# Patient Record
Sex: Female | Born: 1970 | Race: White | Hispanic: No | Smoking: Never smoker
Health system: Southern US, Community
[De-identification: ages and names within clinical notes are randomized; demographics above are authoritative.]

## PROBLEM LIST (undated history)

## (undated) DIAGNOSIS — R002 Palpitations: Secondary | ICD-10-CM

## (undated) DIAGNOSIS — Z8719 Personal history of other diseases of the digestive system: Secondary | ICD-10-CM

## (undated) DIAGNOSIS — R222 Localized swelling, mass and lump, trunk: Secondary | ICD-10-CM

## (undated) DIAGNOSIS — K8020 Calculus of gallbladder without cholecystitis without obstruction: Secondary | ICD-10-CM

## (undated) DIAGNOSIS — Z87448 Personal history of other diseases of urinary system: Secondary | ICD-10-CM

## (undated) DIAGNOSIS — M419 Scoliosis, unspecified: Secondary | ICD-10-CM

## (undated) DIAGNOSIS — Z8639 Personal history of other endocrine, nutritional and metabolic disease: Secondary | ICD-10-CM

## (undated) DIAGNOSIS — H9312 Tinnitus, left ear: Secondary | ICD-10-CM

## (undated) DIAGNOSIS — D179 Benign lipomatous neoplasm, unspecified: Secondary | ICD-10-CM

## (undated) HISTORY — DX: Benign lipomatous neoplasm, unspecified: D17.9

## (undated) HISTORY — PX: DILATION AND CURETTAGE OF UTERUS: SHX78

## (undated) HISTORY — PX: ANAL FISSURE REPAIR: SHX2312

---

## 1995-05-07 HISTORY — PX: BREAST SURGERY: SHX581

## 1998-05-19 ENCOUNTER — Ambulatory Visit (HOSPITAL_COMMUNITY): Admission: RE | Admit: 1998-05-19 | Discharge: 1998-05-19 | Payer: Self-pay | Admitting: *Deleted

## 1998-07-10 ENCOUNTER — Other Ambulatory Visit: Admission: RE | Admit: 1998-07-10 | Discharge: 1998-07-10 | Payer: Self-pay | Admitting: *Deleted

## 1999-03-27 ENCOUNTER — Inpatient Hospital Stay (HOSPITAL_COMMUNITY): Admission: AD | Admit: 1999-03-27 | Discharge: 1999-03-27 | Payer: Self-pay | Admitting: *Deleted

## 1999-06-29 ENCOUNTER — Inpatient Hospital Stay (HOSPITAL_COMMUNITY): Admission: AD | Admit: 1999-06-29 | Discharge: 1999-07-01 | Payer: Self-pay | Admitting: Obstetrics & Gynecology

## 1999-08-06 ENCOUNTER — Other Ambulatory Visit: Admission: RE | Admit: 1999-08-06 | Discharge: 1999-08-06 | Payer: Self-pay | Admitting: *Deleted

## 2000-01-17 ENCOUNTER — Ambulatory Visit (HOSPITAL_COMMUNITY): Admission: RE | Admit: 2000-01-17 | Discharge: 2000-01-17 | Payer: Self-pay | Admitting: Gastroenterology

## 2000-03-07 ENCOUNTER — Other Ambulatory Visit: Admission: RE | Admit: 2000-03-07 | Discharge: 2000-03-07 | Payer: Self-pay | Admitting: Obstetrics and Gynecology

## 2000-07-21 ENCOUNTER — Inpatient Hospital Stay (HOSPITAL_COMMUNITY): Admission: AD | Admit: 2000-07-21 | Discharge: 2000-07-21 | Payer: Self-pay | Admitting: Obstetrics and Gynecology

## 2000-10-15 ENCOUNTER — Inpatient Hospital Stay (HOSPITAL_COMMUNITY): Admission: AD | Admit: 2000-10-15 | Discharge: 2000-10-16 | Payer: Self-pay | Admitting: Obstetrics and Gynecology

## 2000-10-30 ENCOUNTER — Encounter: Admission: RE | Admit: 2000-10-30 | Discharge: 2000-11-29 | Payer: Self-pay | Admitting: Obstetrics and Gynecology

## 2000-11-18 ENCOUNTER — Other Ambulatory Visit: Admission: RE | Admit: 2000-11-18 | Discharge: 2000-11-18 | Payer: Self-pay | Admitting: Obstetrics and Gynecology

## 2001-11-23 ENCOUNTER — Other Ambulatory Visit: Admission: RE | Admit: 2001-11-23 | Discharge: 2001-11-23 | Payer: Self-pay | Admitting: Obstetrics and Gynecology

## 2002-01-19 ENCOUNTER — Emergency Department (HOSPITAL_COMMUNITY): Admission: EM | Admit: 2002-01-19 | Discharge: 2002-01-19 | Payer: Self-pay | Admitting: Emergency Medicine

## 2002-11-29 ENCOUNTER — Other Ambulatory Visit: Admission: RE | Admit: 2002-11-29 | Discharge: 2002-11-29 | Payer: Self-pay | Admitting: Obstetrics and Gynecology

## 2003-03-14 ENCOUNTER — Encounter: Admission: RE | Admit: 2003-03-14 | Discharge: 2003-03-14 | Payer: Self-pay | Admitting: Gastroenterology

## 2003-05-28 ENCOUNTER — Emergency Department (HOSPITAL_COMMUNITY): Admission: EM | Admit: 2003-05-28 | Discharge: 2003-05-28 | Payer: Self-pay | Admitting: Emergency Medicine

## 2006-03-13 ENCOUNTER — Encounter: Admission: RE | Admit: 2006-03-13 | Discharge: 2006-03-13 | Payer: Self-pay | Admitting: Obstetrics and Gynecology

## 2007-03-18 ENCOUNTER — Encounter: Admission: RE | Admit: 2007-03-18 | Discharge: 2007-03-18 | Payer: Self-pay | Admitting: Family Medicine

## 2009-03-10 ENCOUNTER — Encounter: Admission: RE | Admit: 2009-03-10 | Discharge: 2009-03-10 | Payer: Self-pay | Admitting: Obstetrics and Gynecology

## 2009-05-06 DIAGNOSIS — D179 Benign lipomatous neoplasm, unspecified: Secondary | ICD-10-CM

## 2009-05-06 HISTORY — DX: Benign lipomatous neoplasm, unspecified: D17.9

## 2009-12-28 ENCOUNTER — Encounter: Admission: RE | Admit: 2009-12-28 | Discharge: 2009-12-28 | Payer: Self-pay | Admitting: Otolaryngology

## 2010-04-18 ENCOUNTER — Encounter
Admission: RE | Admit: 2010-04-18 | Discharge: 2010-04-18 | Payer: Self-pay | Source: Home / Self Care | Attending: Obstetrics and Gynecology | Admitting: Obstetrics and Gynecology

## 2010-09-21 NOTE — H&P (Signed)
[facility]  Patient:    Jennifer Petersen, Jennifer Petersen                        MRN: [id number] Proc. Date: 06/29/99 Adm. Date:  [id number] Attending:  Genia Del                         History and Physical  DATE OF BIRTH:  10-27-1970  BRIEF HISTORY:  This is a 40 year old G2, P0, A1 with last menstrual period on ay 25, 2000, for an expected date of delivery on July 05, 1999, at 39 weeks and 1 ay gestation.  REASON FOR ADMISSION:  Uterine contractions and regular every 3 minutes since 1:00 a.m.  HISTORY OF PRESENT ILLNESS:  The patient started experiencing uterine contractions during the evening.  They increased and became regular at around 1 a.m. on June 29, 1999.  There were no fluid leaks, no vaginal bleeding. The fetal movements ere positive and no PIH symptoms.  ALLERGIES:  The patient has no known drug allergies.  She was on prenatal vitamins only.  PAST MEDICAL HISTORY:  Positive for breast augmentation and D&C in December of 1999.  PAST OBSTETRICAL HISTORY:  December 1999 - [redacted] weeks gestation started as abortion - incomplete. D&C no complication.  PAST GYNECOLOGIC HISTORY:  Chlamydia treated with antibiotic.  SOCIAL HISTORY:   The patient is married.  She is a nonsmoker.  HISTORY OF PRESENT PREGNANCY:  She had mild spotting in the first trimester. The first semester labs showed a hemoglobin at 13.2, platelets 333, blood type A+, antibodies negative.  RPR nonreactive.  HBSAG negative.  HIV negative in December of 1999 and not redone.  The Pap test was normal in March of 2000.  Gonorrhea and chlamydia were negative in August 2000.  The patient had the triple test in the  second trimester at 16 weeks which came back within normal limits.  She had an ultrasound for review of anatomy at 28 weeks which showed normal review of anatomy and normal placenta with cervical length at 3.85.  The patient had in the third  trimester at  [redacted] weeks gestation a 1 hour glucose tolerance test which was normal and she had group B strep at 35-36 which was negative.  She had a repeat ultrasound at 28 weeks which showed a slightly increased amniotic fluid index at 81 to 99%  percentile and an estimated fetal weight at the 86% percentile.  Dopplers were normal.  Then follow up on amniotic fluid was done at 34 plus weeks and at that  time the amniotic fluid was within normal limits at the 83rd percentile.  The estimated fetal weight was 47 percentile.  The pregnancy was otherwise normal with normal weight gain and normal blood pressure all along.  REVIEW OF SYSTEMS:  Constitutional:  Negative.  HEENT:  Within normal limits. Cardiovascular:  Negative.  Respiratory:  Negative.  Gastrointestinal: Negative. URologic:  Negative.  Neurologic:  Negative.  Dermatome:  Negative.  PHYSICAL EXAMINATION:  GENERAL:  On arrival the patient was uncomfortable with uterine contractions otherwise no apparent distress.  VITAL SIGNS:  Blood pressure 113/69, pulse 88, respiratory rate 24. Temperature was 99.4.  HEART:  S1, S2 normal.  No S3, S4.  No murmur.  HEENT:  Within normal limits.  LUNGS:  Clear.  ABDOMEN:  Gravid with a uterine height at 39 cm per ______.  The presentation is vertex  EXTREMITIES:  Lower limbs are normal and no clonus.  DTRs 2/4 bilaterally.  VAGINAL EXAM:  On arrival at maternity admission shows a cervix that 4 cm, 90% vertex -2, with bulging membranes.  The uterine contractions were every 3-4 minutes during 60 seconds and with moderate intensity.  The fetal heart rate showed accelerations with variability and no deceleration with a baseline around 140-160.  IMPRESSION:  G2, P0, A1 at 39 weeks and 1 day gestation in active labor with reassuring fetal heart rate, group B strep negative.  PLAN:  Admission to labor and delivery.  Continuous fetal heart rate monitoring. Nubain IV and subcu p.r.n., epidural  p.r.n.   Will follow expectantly to a probable vaginal delivery. DD:  06/29/99 TD:  06/29/99 Job: [id number]

## 2010-09-21 NOTE — Procedures (Signed)
[place]  Patient:    Jennifer Petersen, Jennifer Petersen                        MRN: [id number] Proc. Date: 01/17/00 Adm. Date:  [id number] Attending:  Charna Elizabeth CC:         Sung Amabile. Roslyn Smiling, M.D.   Procedure Report  DATE OF BIRTH:  August 08, 1970  PROCEDURE PERFORMED:  Colonoscopy.  ENDOSCOPIST:  Anselmo Rod, M.D.  INSTRUMENT USED:  Olympus video colonoscope.  INDICATIONS:  Rectal bleeding and family history of colon cancer in a 40 year old white female.  Rule out colonic polyps, masses, hemorrhoids, etc.  PREPROCEDURE PREPARATION:  Informed consent was procured from the patient. The patient was fasted for 8 hours prior to the procedure and prepped with a bottle of magnesium citrate and a gallon of NuLytely the night prior to the procedure.  PREPROCEDURE PHYSICAL:  Patient has stable vital signs.  NECK: Supple.  CHEST:  Clear to auscultation. S1, S2 regular.  ABDOMEN:  Soft with normal abdominal bowel sounds.  No hepatosplenomegaly, no masses palpable.  DESCRIPTION OF PROCEDURE:  The patient was placed in left lateral decubitus position and sedated with 50 mg of Demerol and 6 mg of Versed intravenously. Once the patient was adequately sedated and maintained on low-flow oxygen and continuous cardiac monitoring, the Olympus video colonoscope was advanced from the rectum to the cecum without difficulty.  Except for small internal hemorrhoids seen on retroflexion and small external anal tag, no other abnormalities were seen.  No masses or polyps were present.  There was no evidence of diverticular disease.  The patient tolerated the procedure well without complication.  IMPRESSION:  Essentially normal colonoscopy, except for small external anal tag and small internal hemorrhoids.  RECOMMENDATIONS: 1. Colorectal cancer screening is recommended in the next 10 years. 2. A high-fiber diet with liberal fluid intake has been recommended. 3. If the  patient has any abdominal GI symptoms, she is to come to the office    on a p.r.n. basis. DD:  01/17/00 TD:  01/18/00 Job: [id number]

## 2011-03-14 ENCOUNTER — Other Ambulatory Visit: Payer: Self-pay | Admitting: Obstetrics and Gynecology

## 2011-03-14 DIAGNOSIS — Z1231 Encounter for screening mammogram for malignant neoplasm of breast: Secondary | ICD-10-CM

## 2011-04-22 ENCOUNTER — Ambulatory Visit
Admission: RE | Admit: 2011-04-22 | Discharge: 2011-04-22 | Disposition: A | Discharge status: Home / Self Care | Payer: BC Managed Care – PPO | Source: Ambulatory Visit | Attending: Obstetrics and Gynecology | Admitting: Obstetrics and Gynecology

## 2011-04-22 DIAGNOSIS — Z1231 Encounter for screening mammogram for malignant neoplasm of breast: Secondary | ICD-10-CM

## 2011-06-18 ENCOUNTER — Other Ambulatory Visit: Payer: Self-pay | Admitting: Otolaryngology

## 2011-06-18 DIAGNOSIS — R220 Localized swelling, mass and lump, head: Secondary | ICD-10-CM

## 2011-06-20 ENCOUNTER — Ambulatory Visit
Admission: RE | Admit: 2011-06-20 | Discharge: 2011-06-20 | Disposition: A | Discharge status: Home / Self Care | Payer: BC Managed Care – PPO | Source: Ambulatory Visit | Attending: Otolaryngology | Admitting: Otolaryngology

## 2011-06-20 DIAGNOSIS — R221 Localized swelling, mass and lump, neck: Secondary | ICD-10-CM

## 2011-06-20 DIAGNOSIS — R220 Localized swelling, mass and lump, head: Secondary | ICD-10-CM

## 2011-07-02 ENCOUNTER — Other Ambulatory Visit: Payer: Self-pay | Admitting: Otolaryngology

## 2012-03-27 ENCOUNTER — Other Ambulatory Visit: Payer: Self-pay | Admitting: Dermatology

## 2012-03-30 ENCOUNTER — Other Ambulatory Visit: Payer: Self-pay | Admitting: Obstetrics and Gynecology

## 2012-03-30 DIAGNOSIS — Z1231 Encounter for screening mammogram for malignant neoplasm of breast: Secondary | ICD-10-CM

## 2012-04-05 HISTORY — PX: LIPOMA EXCISION: SHX5283

## 2012-05-20 ENCOUNTER — Ambulatory Visit
Admission: RE | Admit: 2012-05-20 | Discharge: 2012-05-20 | Disposition: A | Discharge status: Home / Self Care | Payer: BC Managed Care – PPO | Source: Ambulatory Visit | Attending: Obstetrics and Gynecology | Admitting: Obstetrics and Gynecology

## 2012-05-20 DIAGNOSIS — Z1231 Encounter for screening mammogram for malignant neoplasm of breast: Secondary | ICD-10-CM

## 2013-05-05 ENCOUNTER — Other Ambulatory Visit: Payer: Self-pay

## 2013-05-05 DIAGNOSIS — Z9882 Breast implant status: Secondary | ICD-10-CM

## 2013-05-05 DIAGNOSIS — Z1231 Encounter for screening mammogram for malignant neoplasm of breast: Secondary | ICD-10-CM

## 2013-06-02 ENCOUNTER — Ambulatory Visit
Admission: RE | Admit: 2013-06-02 | Discharge: 2013-06-02 | Disposition: A | Discharge status: Home / Self Care | Payer: BC Managed Care – PPO | Source: Ambulatory Visit

## 2013-06-02 DIAGNOSIS — Z1231 Encounter for screening mammogram for malignant neoplasm of breast: Secondary | ICD-10-CM

## 2013-06-02 DIAGNOSIS — Z9882 Breast implant status: Secondary | ICD-10-CM

## 2014-06-13 ENCOUNTER — Other Ambulatory Visit: Payer: Self-pay

## 2014-06-13 DIAGNOSIS — Z1231 Encounter for screening mammogram for malignant neoplasm of breast: Secondary | ICD-10-CM

## 2014-06-21 ENCOUNTER — Ambulatory Visit
Admission: RE | Admit: 2014-06-21 | Discharge: 2014-06-21 | Disposition: A | Discharge status: Home / Self Care | Payer: BLUE CROSS/BLUE SHIELD | Source: Ambulatory Visit

## 2014-06-21 ENCOUNTER — Encounter (INDEPENDENT_AMBULATORY_CARE_PROVIDER_SITE_OTHER): Payer: Self-pay

## 2014-06-21 DIAGNOSIS — Z1231 Encounter for screening mammogram for malignant neoplasm of breast: Secondary | ICD-10-CM

## 2015-05-26 ENCOUNTER — Other Ambulatory Visit: Payer: Self-pay

## 2015-05-26 DIAGNOSIS — Z1231 Encounter for screening mammogram for malignant neoplasm of breast: Secondary | ICD-10-CM

## 2015-06-28 ENCOUNTER — Ambulatory Visit
Admission: RE | Admit: 2015-06-28 | Discharge: 2015-06-28 | Disposition: A | Discharge status: Home / Self Care | Payer: BLUE CROSS/BLUE SHIELD | Source: Ambulatory Visit

## 2015-06-28 DIAGNOSIS — Z1231 Encounter for screening mammogram for malignant neoplasm of breast: Secondary | ICD-10-CM

## 2015-07-10 ENCOUNTER — Other Ambulatory Visit: Payer: Self-pay

## 2015-07-10 ENCOUNTER — Ambulatory Visit
Admission: RE | Admit: 2015-07-10 | Discharge: 2015-07-10 | Disposition: A | Discharge status: Home / Self Care | Payer: BLUE CROSS/BLUE SHIELD | Source: Ambulatory Visit

## 2015-07-10 ENCOUNTER — Ambulatory Visit: Payer: BLUE CROSS/BLUE SHIELD

## 2015-07-10 DIAGNOSIS — Z1231 Encounter for screening mammogram for malignant neoplasm of breast: Secondary | ICD-10-CM

## 2015-07-12 ENCOUNTER — Ambulatory Visit: Payer: BLUE CROSS/BLUE SHIELD

## 2016-05-14 DIAGNOSIS — Z800 Family history of malignant neoplasm of digestive organs: Secondary | ICD-10-CM | POA: Diagnosis not present

## 2016-05-14 DIAGNOSIS — Z1211 Encounter for screening for malignant neoplasm of colon: Secondary | ICD-10-CM | POA: Diagnosis not present

## 2016-05-14 LAB — BASIC METABOLIC PANEL
BUN: 16 (ref 4–21)
BUN: 16 (ref 4–21)
CREATININE: 0.6 (ref 0.5–1.1)
Creatinine: 0.6 (ref <=1.1)
Glucose: 95
POTASSIUM: 5.3 (ref 3.4–5.3)
Potassium: 5.3 (ref 3.4–5.3)
SODIUM: 141 (ref 137–147)
Sodium: 141 (ref 137–147)

## 2016-05-14 LAB — CBC AND DIFFERENTIAL
HCT: 38 (ref 36–46)
HCT: 38 (ref 36–46)
Hemoglobin: 12.8 (ref 12.0–16.0)
Hemoglobin: 12.8 (ref 12.0–16.0)
Platelets: 357 (ref 150–399)
Platelets: 357 (ref 150–399)
WBC: 7.2
WBC: 7.2

## 2016-05-14 LAB — HEPATIC FUNCTION PANEL
ALK PHOS: 34 (ref 25–125)
ALT: 11 (ref 7–35)
ALT: 11 (ref 7–35)
AST: 18 (ref 13–35)
AST: 18 (ref 13–35)
Alkaline Phosphatase: 34 (ref 25–125)
Bilirubin, Total: 0.5

## 2016-05-14 LAB — TSH: TSH: 1.34 (ref 0.41–5.90)

## 2016-07-12 DIAGNOSIS — Z1211 Encounter for screening for malignant neoplasm of colon: Secondary | ICD-10-CM | POA: Diagnosis not present

## 2016-07-12 DIAGNOSIS — Z800 Family history of malignant neoplasm of digestive organs: Secondary | ICD-10-CM | POA: Diagnosis not present

## 2016-07-12 HISTORY — PX: COLONOSCOPY: SHX174

## 2016-08-26 DIAGNOSIS — D2239 Melanocytic nevi of other parts of face: Secondary | ICD-10-CM | POA: Diagnosis not present

## 2016-08-26 DIAGNOSIS — D2262 Melanocytic nevi of left upper limb, including shoulder: Secondary | ICD-10-CM | POA: Diagnosis not present

## 2016-08-26 DIAGNOSIS — L821 Other seborrheic keratosis: Secondary | ICD-10-CM | POA: Diagnosis not present

## 2016-08-26 DIAGNOSIS — D2261 Melanocytic nevi of right upper limb, including shoulder: Secondary | ICD-10-CM | POA: Diagnosis not present

## 2016-09-27 DIAGNOSIS — Z13220 Encounter for screening for lipoid disorders: Secondary | ICD-10-CM | POA: Diagnosis not present

## 2016-09-27 DIAGNOSIS — Z0000 Encounter for general adult medical examination without abnormal findings: Secondary | ICD-10-CM | POA: Diagnosis not present

## 2016-09-27 DIAGNOSIS — Z130 Encounter for screening for diseases of the blood and blood-forming organs and certain disorders involving the immune mechanism: Secondary | ICD-10-CM | POA: Diagnosis not present

## 2016-09-27 DIAGNOSIS — N951 Menopausal and female climacteric states: Secondary | ICD-10-CM | POA: Diagnosis not present

## 2016-09-27 DIAGNOSIS — Z1321 Encounter for screening for nutritional disorder: Secondary | ICD-10-CM | POA: Diagnosis not present

## 2016-09-27 DIAGNOSIS — Z1329 Encounter for screening for other suspected endocrine disorder: Secondary | ICD-10-CM | POA: Diagnosis not present

## 2016-09-27 LAB — LIPID PANEL
Cholesterol: 220 — AB (ref 0–200)
HDL: 71 — AB (ref 35–70)
LDL Cholesterol: 131
Triglycerides: 89 (ref 40–160)

## 2016-09-27 LAB — CBC AND DIFFERENTIAL
HCT: 41 (ref 36–46)
Hemoglobin: 12.9 (ref 12.0–16.0)
Platelets: 310 (ref 150–399)
WBC: 5.5

## 2016-09-27 LAB — BASIC METABOLIC PANEL
Creatinine: 0.7 (ref <=1.1)
Glucose: 105
Sodium: 142 (ref 137–147)

## 2016-09-27 LAB — HEPATIC FUNCTION PANEL
ALT: 14 (ref 7–35)
AST: 17 (ref 13–35)
Alkaline Phosphatase: 32 (ref 25–125)
Bilirubin, Total: 0.4

## 2016-09-27 LAB — TSH: TSH: 1.25 (ref <=5.90)

## 2016-10-02 DIAGNOSIS — Z1231 Encounter for screening mammogram for malignant neoplasm of breast: Secondary | ICD-10-CM | POA: Diagnosis not present

## 2016-10-02 DIAGNOSIS — Z01419 Encounter for gynecological examination (general) (routine) without abnormal findings: Secondary | ICD-10-CM | POA: Diagnosis not present

## 2016-10-02 DIAGNOSIS — Z6825 Body mass index (BMI) 25.0-25.9, adult: Secondary | ICD-10-CM | POA: Diagnosis not present

## 2016-10-02 DIAGNOSIS — R300 Dysuria: Secondary | ICD-10-CM | POA: Diagnosis not present

## 2016-10-08 ENCOUNTER — Ambulatory Visit (INDEPENDENT_AMBULATORY_CARE_PROVIDER_SITE_OTHER): Payer: BLUE CROSS/BLUE SHIELD | Admitting: Family Medicine

## 2016-10-08 ENCOUNTER — Encounter: Payer: Self-pay | Admitting: Family Medicine

## 2016-10-08 VITALS — BP 106/72 | HR 65 | Ht 68.0 in | Wt 166.0 lb

## 2016-10-08 DIAGNOSIS — R002 Palpitations: Secondary | ICD-10-CM | POA: Insufficient documentation

## 2016-10-08 DIAGNOSIS — Z8639 Personal history of other endocrine, nutritional and metabolic disease: Secondary | ICD-10-CM

## 2016-10-08 DIAGNOSIS — Z8051 Family history of malignant neoplasm of kidney: Secondary | ICD-10-CM

## 2016-10-08 DIAGNOSIS — Z9882 Breast implant status: Secondary | ICD-10-CM | POA: Insufficient documentation

## 2016-10-08 DIAGNOSIS — Z83438 Family history of other disorder of lipoprotein metabolism and other lipidemia: Secondary | ICD-10-CM | POA: Insufficient documentation

## 2016-10-08 DIAGNOSIS — Z833 Family history of diabetes mellitus: Secondary | ICD-10-CM | POA: Insufficient documentation

## 2016-10-08 DIAGNOSIS — Z832 Family history of diseases of the blood and blood-forming organs and certain disorders involving the immune mechanism: Secondary | ICD-10-CM

## 2016-10-08 DIAGNOSIS — Z635 Disruption of family by separation and divorce: Secondary | ICD-10-CM | POA: Insufficient documentation

## 2016-10-08 DIAGNOSIS — Z8719 Personal history of other diseases of the digestive system: Secondary | ICD-10-CM | POA: Insufficient documentation

## 2016-10-08 DIAGNOSIS — Z8249 Family history of ischemic heart disease and other diseases of the circulatory system: Secondary | ICD-10-CM

## 2016-10-08 DIAGNOSIS — R635 Abnormal weight gain: Secondary | ICD-10-CM

## 2016-10-08 DIAGNOSIS — Z86018 Personal history of other benign neoplasm: Secondary | ICD-10-CM | POA: Insufficient documentation

## 2016-10-08 DIAGNOSIS — M4120 Other idiopathic scoliosis, site unspecified: Secondary | ICD-10-CM | POA: Insufficient documentation

## 2016-10-08 NOTE — Progress Notes (Signed)
New patient office visit note:  Impression and Recommendations:    1. Unexplained weight gain/ difficult to lose   2. Fluttering sensation of heart   3. Family history of renal cancer * 2 and prostate ca in Father   4. Family history of mixed hyperlipidemia   5. Family history of early CAD-  P GF 3 AMI by age 48   6. Family history of diabetes mellitus   7. Hx of hyperkalemia- Asx      No problem-specific Assessment & Plan notes found for this encounter.   The patient was counseled, risk factors were discussed, anticipatory guidance given.   No orders of the defined types were placed in this encounter.    Discontinued Medications   No medications on file      No orders of the defined types were placed in this encounter.    Gross side effects, risk and benefits, and alternatives of medications discussed with patient.  Patient is aware that all medications have potential side effects and we are unable to predict every side effect or drug-drug interaction that may occur.  Expresses verbal understanding and consents to current therapy plan and treatment regimen.  Return in about 3 months (around 01/08/2017) for sooner if pt desires wt mgt counseling.  Please see AVS handed out to patient at the end of our visit for further patient instructions/ counseling done pertaining to today's office visit.    Note: This document was prepared using Dragon voice recognition software and may include unintentional dictation errors.  ----------------------------------------------------------------------------------------------------------------------    Subjective:    Chief complaint:   Chief Complaint  Patient presents with  . Establish Care     HPI: Jennifer Petersen is a pleasant 46 y.o. female who presents to [place] at [facility] today to review their medical history with me and establish care.   I asked the patient to review their chronic problem list  with me to ensure everything was updated and accurate.    All recent office visits with other providers, any medical records that patient brought in etc  - I reviewed today.     Also asked pt to get me medical records from [facility] providers/ specialists that they had seen within the past 3-5 years- if they are in private practice and/or do not work for a [employer], [facility], [place], [place] or [employer] owned practice.  Told them to call their specialists to clarify this if they are not sure.    Worried abiout wt gain and harder to get off lately.  Hormones checked by GYN, and [facility].  - working out with trainer at [employer]- twice [employer].   Used to be able to always lose wt easily   High K levels- recently at other doc OV's.   H/o sensation of fluttering of heart- lasting a few seconds only.  -   No Sx, not related to exercise/ exertion etc   Wt Readings from Last 3 Encounters:  10/08/16 166 lb (75.3 kg)   BP Readings from Last 3 Encounters:  10/08/16 106/72   Pulse Readings from Last 3 Encounters:  10/08/16 65   BMI Readings from Last 3 Encounters:  10/08/16 25.24 kg/m    Patient Care Team    Relationship Specialty Notifications Start End  Mellody Dance, DO PCP - General Family Medicine  10/08/16   Juanita Craver, MD Consulting Physician Gastroenterology  10/08/16   Martinique, Amy, MD Consulting Physician  Dermatology  10/08/16   Brien Few, MD Consulting Physician Obstetrics and Gynecology  10/08/16     Patient Active Problem List   Diagnosis Date Noted  . Scoliosis (and kyphoscoliosis), idiopathic 10/08/2016  . Unexplained weight gain/ difficult to lose 10/08/2016  . Family history of renal cancer * 2 and prostate ca in Father 10/08/2016  . Family history of mixed hyperlipidemia 10/08/2016  . Family history of early CAD-  P GF 3 AMI by age 35 10/08/2016  . Family history of diabetes mellitus 10/08/2016  . h/o Divorce 10/08/2016  . History of anal fissure repair  10/08/2016  . H/O bilateral breast implants 10/08/2016  . H/O lipoma- sx removed 10/08/2016  . Hx of hyperkalemia- Asx 10/08/2016  . Fluttering sensation of heart 10/08/2016     Past Medical History:  Diagnosis Date  . Lipoma 2011     Past Medical History:  Diagnosis Date  . Lipoma 2011     Past Surgical History:  Procedure Laterality Date  . BREAST SURGERY  1997   Implants     Family History  Problem Relation Age of Onset  . Heart attack Maternal Grandmother   . Diabetes Maternal Grandmother   . Heart attack Paternal Grandfather      History  Drug Use No     History  Alcohol Use No     History  Smoking Status  . Never Smoker  Smokeless Tobacco  . Never Used     No outpatient encounter prescriptions on file as of 10/08/2016.   No facility-administered encounter medications on file as of 10/08/2016.     Allergies: Patient has no known allergies.   ROS   Objective:   Blood pressure 106/72, pulse 65, height 5\' 8"  (1.727 m), weight 166 lb (75.3 kg), last menstrual period 09/21/2016, SpO2 100 %. Body mass index is 25.24 kg/m. General: Well Developed, well nourished, and in no acute distress.  Neuro: Alert and oriented x3, extra-ocular muscles intact, sensation grossly intact.  HEENT:Colony/AT, PERRLA, neck supple, No carotid bruits Skin: no gross rashes  Cardiac: Regular rate and rhythm Respiratory: Essentially clear to auscultation bilaterally. Not using accessory muscles, speaking in full sentences.  Abdominal: not grossly distended Musculoskeletal: Ambulates w/o diff, FROM * 4 ext.  Vasc: less 2 sec cap RF, warm and pink  Psych:  No HI/SI, judgement and insight good, Euthymic mood. Full Affect.    No results found for this or any previous visit (from the past 2160 hour(s)).

## 2016-10-08 NOTE — Patient Instructions (Addendum)
I recommended to patient that she use an app such as lose it rather than her current one, which would help [place] gauge percentage of protein that she eats, carbs and fats etc.     Please realize, EXERCISE IS MEDICINE!  -  American Heart Association [facility]) guidelines for exercise : If you are in good health, without any medical conditions, you should engage in 150 minutes of moderate intensity aerobic activity per week.  This means you should be huffing and puffing throughout your workout.   Engaging in regular exercise will improve brain function and memory, as well as improve mood, boost immune system and help with weight management.  As well as the other, more well-known effects of exercise such as decreasing blood sugar levels, decreasing blood pressure,  and decreasing bad cholesterol levels/ increasing good cholesterol levels.     -  The AHA strongly endorses consumption of a diet that contains a variety of foods from all the food categories with an emphasis on fruits and vegetables; fat-free and low-fat dairy products; cereal and grain products; legumes and nuts; and fish, poultry, and/or extra lean meats.    Excessive food intake, especially of foods high in saturated and trans fats, sugar, and salt, should be avoided.    Adequate water intake of roughly 1/2 of your weight in pounds, should equal the ounces of water per day you should drink.  So for instance, if you're 200 pounds, that would be 100 ounces of water per day.         Mediterranean Diet  Why follow it? Research shows. . Those who follow the Mediterranean diet have a reduced risk of heart disease  . The diet is associated with a reduced incidence of Parkinson's and Alzheimer's diseases . People following the diet may have longer life expectancies and lower rates of chronic diseases  . The Dietary Guidelines for Americans recommends the Mediterranean diet as an eating plan to promote health and prevent disease  What Is the  Mediterranean Diet?  . Healthy eating plan based on typical foods and recipes of Mediterranean-style cooking . The diet is primarily a plant based diet; these foods should make up a majority of meals   Starches - Plant based foods should make up a majority of meals - They are an important sources of vitamins, minerals, energy, antioxidants, and fiber - Choose whole grains, foods high in fiber and minimally processed items  - Typical grain sources include wheat, oats, barley, corn, brown rice, bulgar, farro, millet, polenta, couscous  - Various types of beans include chickpeas, lentils, fava beans, black beans, white beans   Fruits  Veggies - Large quantities of antioxidant rich fruits & veggies; 6 or more servings  - Vegetables can be eaten raw or lightly drizzled with oil and cooked  - Vegetables common to the traditional Mediterranean Diet include: artichokes, arugula, beets, broccoli, brussel sprouts, cabbage, carrots, celery, collard greens, cucumbers, eggplant, kale, leeks, lemons, lettuce, mushrooms, okra, onions, peas, peppers, potatoes, pumpkin, radishes, rutabaga, shallots, spinach, sweet potatoes, turnips, zucchini - Fruits common to the Mediterranean Diet include: apples, apricots, avocados, cherries, clementines, dates, figs, grapefruits, grapes, melons, nectarines, oranges, peaches, pears, pomegranates, strawberries, tangerines  Fats - Replace butter and margarine with healthy oils, such as olive oil, canola oil, and tahini  - Limit nuts to no more than a handful a day  - Nuts include walnuts, almonds, pecans, pistachios, pine nuts  - Limit or avoid candied, honey roasted or heavily salted  nuts - Olives are central to the Mediterranean diet - can be eaten whole or used in a variety of dishes   Meats Protein - Limiting red meat: no more than a few times a month - When eating red meat: choose lean cuts and keep the portion to the size of deck of cards - Eggs: approx. 0 to 4 times a  week  - Fish and lean poultry: at least 2 a week  - Healthy protein sources include, chicken, [place], lean beef, lamb - Increase intake of seafood such as tuna, salmon, trout, mackerel, shrimp, scallops - Avoid or limit high fat processed meats such as sausage and bacon  Dairy - Include moderate amounts of low fat dairy products  - Focus on healthy dairy such as fat free yogurt, skim milk, low or reduced fat cheese - Limit dairy products higher in fat such as whole or 2% milk, cheese, ice cream  Alcohol - Moderate amounts of red wine is ok  - No more than 5 oz daily for women (all ages) and men older than age 4  - No more than 10 oz of wine daily for men younger than 69  Other - Limit sweets and other desserts  - Use herbs and spices instead of salt to flavor foods  - Herbs and spices common to the traditional Mediterranean Diet include: basil, bay leaves, chives, cloves, cumin, fennel, garlic, lavender, marjoram, mint, oregano, parsley, pepper, rosemary, sage, savory, sumac, tarragon, thyme   It's not just a diet, it's a lifestyle:  . The Mediterranean diet includes lifestyle factors typical of those in the region  . Foods, drinks and meals are best eaten with others and savored . Daily physical activity is important for overall good health . This could be strenuous exercise like running and aerobics . This could also be more leisurely activities such as walking, housework, yard-work, or taking the stairs . Moderation is the key; a balanced and healthy diet accommodates most foods and drinks . Consider portion sizes and frequency of consumption of certain foods   Meal Ideas & Options:  . Breakfast:  o Whole wheat toast or whole wheat English muffins with peanut butter & hard boiled egg o Steel cut oats topped with apples & cinnamon and skim milk  o Fresh fruit: banana, strawberries, melon, berries, peaches  o Smoothies: strawberries, bananas, greek yogurt, peanut butter o Low fat  greek yogurt with blueberries and granola  o Egg white omelet with spinach and mushrooms o Breakfast couscous: whole wheat couscous, apricots, skim milk, cranberries  . Sandwiches:  o Hummus and grilled vegetables (peppers, zucchini, squash) on whole wheat bread   o Grilled chicken on whole wheat pita with lettuce, tomatoes, cucumbers or tzatziki  o Tuna salad on whole wheat bread: tuna salad made with greek yogurt, olives, red peppers, capers, green onions o Garlic rosemary lamb pita: lamb sauted with garlic, rosemary, salt & pepper; add lettuce, cucumber, greek yogurt to pita - flavor with lemon juice and black pepper  . Seafood:  o Mediterranean grilled salmon, seasoned with garlic, basil, parsley, lemon juice and black pepper o Shrimp, lemon, and spinach whole-grain pasta salad made with low fat greek yogurt  o Seared scallops with lemon orzo  o Seared tuna steaks seasoned salt, pepper, coriander topped with tomato mixture of olives, tomatoes, olive oil, minced garlic, parsley, green onions and cappers  . Meats:  o Herbed greek chicken salad with kalamata olives, cucumber, feta  o Red bell  peppers stuffed with spinach, bulgur, lean ground beef (or lentils) & topped with feta   o Kebabs: skewers of chicken, tomatoes, onions, zucchini, squash  o [place] burgers: made with red onions, mint, dill, lemon juice, feta cheese topped with roasted red peppers . Vegetarian o Cucumber salad: cucumbers, artichoke hearts, celery, red onion, feta cheese, tossed in olive oil & lemon juice  o Hummus and whole grain pita points with a greek salad (lettuce, tomato, feta, olives, cucumbers, red onion) o Lentil soup with celery, carrots made with vegetable broth, garlic, salt and pepper  o Tabouli salad: parsley, bulgur, mint, scallions, cucumbers, tomato, radishes, lemon juice, olive oil, salt and pepper.

## 2017-01-13 ENCOUNTER — Ambulatory Visit: Payer: BLUE CROSS/BLUE SHIELD | Admitting: Family Medicine

## 2017-06-11 ENCOUNTER — Telehealth: Payer: Self-pay | Admitting: Family Medicine

## 2017-06-11 NOTE — Telephone Encounter (Signed)
Left patient message to contact office for Provider required OV.  --glh

## 2017-06-18 DIAGNOSIS — H93A9 Pulsatile tinnitus, unspecified ear: Secondary | ICD-10-CM | POA: Diagnosis not present

## 2017-06-26 ENCOUNTER — Other Ambulatory Visit: Payer: Self-pay | Admitting: Otolaryngology

## 2017-06-26 DIAGNOSIS — H93A9 Pulsatile tinnitus, unspecified ear: Secondary | ICD-10-CM

## 2017-06-30 ENCOUNTER — Other Ambulatory Visit: Payer: Self-pay | Admitting: Otolaryngology

## 2017-06-30 DIAGNOSIS — H93A2 Pulsatile tinnitus, left ear: Secondary | ICD-10-CM

## 2017-07-05 ENCOUNTER — Emergency Department (HOSPITAL_COMMUNITY)
Admission: EM | Admit: 2017-07-05 | Discharge: 2017-07-05 | Disposition: A | Discharge status: Home / Self Care | Payer: BLUE CROSS/BLUE SHIELD | Attending: Emergency Medicine | Admitting: Emergency Medicine

## 2017-07-05 ENCOUNTER — Other Ambulatory Visit: Payer: Self-pay

## 2017-07-05 ENCOUNTER — Emergency Department (HOSPITAL_COMMUNITY): Payer: BLUE CROSS/BLUE SHIELD

## 2017-07-05 ENCOUNTER — Encounter (HOSPITAL_COMMUNITY): Payer: Self-pay | Admitting: Emergency Medicine

## 2017-07-05 DIAGNOSIS — R002 Palpitations: Secondary | ICD-10-CM | POA: Diagnosis not present

## 2017-07-05 DIAGNOSIS — R112 Nausea with vomiting, unspecified: Secondary | ICD-10-CM | POA: Insufficient documentation

## 2017-07-05 DIAGNOSIS — R072 Precordial pain: Secondary | ICD-10-CM | POA: Diagnosis not present

## 2017-07-05 DIAGNOSIS — Z86018 Personal history of other benign neoplasm: Secondary | ICD-10-CM | POA: Insufficient documentation

## 2017-07-05 DIAGNOSIS — R079 Chest pain, unspecified: Secondary | ICD-10-CM | POA: Diagnosis not present

## 2017-07-05 LAB — I-STAT BETA HCG BLOOD, ED (MC, WL, AP ONLY): I-stat hCG, quantitative: <5 m[IU]/mL (ref <5)

## 2017-07-05 LAB — CBC
HEMATOCRIT: 39.1 % (ref 36.0–46.0)
HEMOGLOBIN: 13.2 g/dL (ref 12.0–15.0)
MCH: 29.5 pg (ref 26.0–34.0)
MCHC: 33.8 g/dL (ref 30.0–36.0)
MCV: 87.3 fL (ref 78.0–100.0)
Platelets: 297 10*3/uL (ref 150–400)
RBC: 4.48 MIL/uL (ref 3.87–5.11)
RDW: 13.6 % (ref 11.5–15.5)
WBC: 12.5 10*3/uL — ABNORMAL HIGH (ref 4.0–10.5)

## 2017-07-05 LAB — BASIC METABOLIC PANEL
Anion gap: 8 (ref 5–15)
BUN: 15 mg/dL (ref 6–20)
CO2: 23 mmol/L (ref 22–32)
Calcium: 8.6 mg/dL — ABNORMAL LOW (ref 8.9–10.3)
Chloride: 106 mmol/L (ref 101–111)
Creatinine, Ser: 0.56 mg/dL (ref 0.44–1.00)
GFR calc Af Amer: >60 mL/min (ref >60)
GFR calc non Af Amer: >60 mL/min (ref >60)
GLUCOSE: 105 mg/dL — AB (ref 65–99)
POTASSIUM: 4.4 mmol/L (ref 3.5–5.1)
SODIUM: 137 mmol/L (ref 135–145)

## 2017-07-05 LAB — HEPATIC FUNCTION PANEL
ALBUMIN: 3.4 g/dL — AB (ref 3.5–5.0)
ALK PHOS: 35 U/L — AB (ref 38–126)
ALT: 13 U/L — AB (ref 14–54)
AST: 18 U/L (ref 15–41)
Bilirubin, Direct: <0.1 mg/dL — ABNORMAL LOW (ref 0.1–0.5)
TOTAL PROTEIN: 6.7 g/dL (ref 6.5–8.1)
Total Bilirubin: 0.6 mg/dL (ref 0.3–1.2)

## 2017-07-05 LAB — I-STAT TROPONIN, ED
Troponin i, poc: 0 ng/mL (ref 0.00–0.08)
Troponin i, poc: 0 ng/mL (ref 0.00–0.08)

## 2017-07-05 LAB — LIPASE, BLOOD: Lipase: 32 U/L (ref 11–51)

## 2017-07-05 MED ORDER — ONDANSETRON HCL 4 MG/2ML IJ SOLN
4.0000 mg | Freq: Once | INTRAMUSCULAR | Status: AC
  Administered 2017-07-05: 4 mg via INTRAVENOUS
  Filled 2017-07-05: qty 2

## 2017-07-05 MED ORDER — FAMOTIDINE 20 MG PO TABS
20.0000 mg | ORAL_TABLET | Freq: Once | ORAL | Status: AC
  Administered 2017-07-05: 20 mg via ORAL
  Filled 2017-07-05: qty 1

## 2017-07-05 MED ORDER — SODIUM CHLORIDE 0.9 % IV BOLUS (SEPSIS)
1000.0000 mL | Freq: Once | INTRAVENOUS | Status: AC
  Administered 2017-07-05: 1000 mL via INTRAVENOUS

## 2017-07-05 MED ORDER — ALUM & MAG HYDROXIDE-SIMETH 200-200-20 MG/5ML PO SUSP
15.0000 mL | Freq: Once | ORAL | Status: AC
  Administered 2017-07-05: 15 mL via ORAL
  Filled 2017-07-05: qty 30

## 2017-07-05 NOTE — Discharge Instructions (Signed)
It was our pleasure to provide your ER care today - we hope that you feel better.  Follow up with primary care doctor in the coming week - call office to arrange appointment.   If gi or reflux symptoms, try prilosec or pepcid and/or maalox as need.  Return to ER if new or worsening symptoms, fevers, persistent vomiting, new or severe abdominal pain, recurrent chest pain, increased trouble breathing, other concern.

## 2017-07-05 NOTE — ED Triage Notes (Signed)
Patient presents to ED for assessment of waking up this morning to chest pressure, a "squeezing" sensation around his neck, intermittent nausea, palpitations.  Patient states she has had a "whooshing" sensation to her left ear for several months now, and her ENT just ordered a CTA of her head and neck, but it's not due for another week.

## 2017-07-05 NOTE — ED Provider Notes (Addendum)
[facility] EMERGENCY DEPARTMENT Provider Note   CSN: [id number] Arrival date & time: 07/05/17  1530     History   Chief Complaint Chief Complaint  Patient presents with  . Palpitations    HPI Jennifer Petersen is a 47 y.o. female.  Patient c/o discomfort sensation in lower chest/midline/xiphoid area all day. States awoke with feeling the discomfort. Also noted nausea with one episode or emesis. Discomfort is mild, constant, non radiating. No back or flank pain. No hx same pain. No exertional chest pain or discomfort. No diaphoresis or sob. No pleuritic pain. Denies abd pain. Denies hx gerd. No heartburn. Denies hx gallstones. Denies cough or uri symptoms. No fever or chills.   The history is provided by the patient.  Palpitations   Associated symptoms include chest pain, nausea and vomiting. Pertinent negatives include no fever, no numbness, no abdominal pain, no headaches, no back pain, no weakness, no cough and no shortness of breath.    Past Medical History:  Diagnosis Date  . Lipoma 2011    Patient Active Problem List   Diagnosis Date Noted  . Scoliosis (and kyphoscoliosis), idiopathic 10/08/2016  . Unexplained weight gain/ difficult to lose 10/08/2016  . Family history of renal cancer * 2 and prostate ca in Father 10/08/2016  . Family history of mixed hyperlipidemia 10/08/2016  . Family history of early CAD-  P GF 3 AMI by age 56 10/08/2016  . Family history of diabetes mellitus 10/08/2016  . h/o Divorce 10/08/2016  . History of anal fissure repair 10/08/2016  . H/O bilateral breast implants 10/08/2016  . H/O lipoma- sx removed 10/08/2016  . Hx of hyperkalemia- Asx 10/08/2016  . Fluttering sensation of heart 10/08/2016    Past Surgical History:  Procedure Laterality Date  . BREAST SURGERY  1997   Implants    OB History    Gravida Para Term Preterm AB Living   3 2 2   1      SAB TAB Ectopic Multiple Live Births   1               Home  Medications    Prior to Admission medications   Not on File    Family History Family History  Problem Relation Age of Onset  . Heart attack Maternal Grandmother   . Diabetes Maternal Grandmother   . Heart attack Paternal Grandfather     Social History Social History   Tobacco Use  . Smoking status: Never Smoker  . Smokeless tobacco: Never Used  Substance Use Topics  . Alcohol use: No  . Drug use: No     Allergies   Patient has no known allergies.   Review of Systems Review of Systems  Constitutional: Negative for fever.  HENT: Negative for sore throat.   Eyes: Negative for visual disturbance.  Respiratory: Negative for cough and shortness of breath.   Cardiovascular: Positive for chest pain. Negative for palpitations.  Gastrointestinal: Positive for nausea and vomiting. Negative for abdominal pain and diarrhea.  Genitourinary: Negative for dysuria and flank pain.  Musculoskeletal: Negative for back pain and neck pain.  Skin: Negative for rash.  Neurological: Negative for weakness, numbness and headaches.       States intermittent episodes dizziness for 2-3 months - no acute vertigo/dizziness today, has seen ENT and is to have imaging study this week.   Hematological: Does not bruise/bleed easily.  Psychiatric/Behavioral: Negative for confusion.     Physical Exam Updated Vital Signs BP 105/63  Pulse 74   Temp 99 F (37.2 C) (Oral)   Resp 14   SpO2 100%   Physical Exam  Constitutional: She appears well-developed and well-nourished. No distress.  HENT:  Head: Atraumatic.  Eyes: Conjunctivae are normal. No scleral icterus.  Neck: Neck supple. No tracheal deviation present.  Cardiovascular: Normal rate, regular rhythm, normal heart sounds and intact distal pulses.  No murmur heard. Pulmonary/Chest: Effort normal and breath sounds normal. No respiratory distress.  Abdominal: Soft. Normal appearance. She exhibits no distension. There is no tenderness.    Musculoskeletal: She exhibits no edema.  Neurological: She is alert.  Skin: Skin is warm and dry. No rash noted.  Psychiatric: She has a normal mood and affect.  Nursing note and vitals reviewed.    ED Treatments / Results  Labs (all labs ordered are listed, but only abnormal results are displayed) Results for orders placed or performed during the hospital encounter of 02/72/53  Basic metabolic panel  Result Value Ref Range   Sodium 137 135 - 145 mmol/L   Potassium 4.4 3.5 - 5.1 mmol/L   Chloride 106 101 - 111 mmol/L   CO2 23 22 - 32 mmol/L   Glucose, Bld 105 (H) 65 - 99 mg/dL   BUN 15 6 - 20 mg/dL   Creatinine, Ser 0.56 0.44 - 1.00 mg/dL   Calcium 8.6 (L) 8.9 - 10.3 mg/dL   GFR calc non Af Amer >60 >60 mL/min   GFR calc Af Amer >60 >60 mL/min   Anion gap 8 5 - 15  CBC  Result Value Ref Range   WBC 12.5 (H) 4.0 - 10.5 K/uL   RBC 4.48 3.87 - 5.11 MIL/uL   Hemoglobin 13.2 12.0 - 15.0 g/dL   HCT 39.1 36.0 - 46.0 %   MCV 87.3 78.0 - 100.0 fL   MCH 29.5 26.0 - 34.0 pg   MCHC 33.8 30.0 - 36.0 g/dL   RDW 13.6 11.5 - 15.5 %   Platelets 297 150 - 400 K/uL  Hepatic function panel  Result Value Ref Range   Total Protein 6.7 6.5 - 8.1 g/dL   Albumin 3.4 (L) 3.5 - 5.0 g/dL   AST 18 15 - 41 U/L   ALT 13 (L) 14 - 54 U/L   Alkaline Phosphatase 35 (L) 38 - 126 U/L   Total Bilirubin 0.6 0.3 - 1.2 mg/dL   Bilirubin, Direct <0.1 (L) 0.1 - 0.5 mg/dL   Indirect Bilirubin NOT CALCULATED 0.3 - 0.9 mg/dL  Lipase, blood  Result Value Ref Range   Lipase 32 11 - 51 U/L  I-stat troponin, ED  Result Value Ref Range   Troponin i, poc 0.00 0.00 - 0.08 ng/mL   Comment 3          I-Stat beta hCG blood, ED  Result Value Ref Range   I-stat hCG, quantitative <5.0 <5 mIU/mL   Comment 3          I-stat troponin, ED  Result Value Ref Range   Troponin i, poc 0.00 0.00 - 0.08 ng/mL   Comment 3           Dg Chest 2 View  Result Date: 07/05/2017 CLINICAL DATA:  Chest pain, palpitations EXAM:  CHEST  2 VIEW COMPARISON:  None. FINDINGS: There is no focal parenchymal opacity. There is no pleural effusion or pneumothorax. The heart and mediastinal contours are unremarkable. No acute osseous abnormality. S-shaped scoliosis of the thoracolumbar spine. IMPRESSION: No active cardiopulmonary disease. Electronically  Signed   By: Kathreen Devoid   On: 07/05/2017 16:21    EKG  EKG Interpretation  Date/Time:  Saturday July 05 2017 15:40:11 EST Ventricular Rate:  82 PR Interval:  120 QRS Duration: 82 QT Interval:  372 QTC Calculation: 434 R Axis:   59 Text Interpretation:  Normal sinus rhythm Non-specific ST-t changes Confirmed by Lajean Saver [phone]) on 07/05/2017 5:08:40 PM       Radiology Dg Chest 2 View  Result Date: 07/05/2017 CLINICAL DATA:  Chest pain, palpitations EXAM: CHEST  2 VIEW COMPARISON:  None. FINDINGS: There is no focal parenchymal opacity. There is no pleural effusion or pneumothorax. The heart and mediastinal contours are unremarkable. No acute osseous abnormality. S-shaped scoliosis of the thoracolumbar spine. IMPRESSION: No active cardiopulmonary disease. Electronically Signed   By: Kathreen Devoid   On: 07/05/2017 16:21    Procedures Procedures (including critical care time)  Medications Ordered in ED Medications  famotidine (PEPCID) tablet 20 mg (not administered)  alum & mag hydroxide-simeth (MAALOX/MYLANTA) 200-200-20 MG/5ML suspension 15 mL (not administered)  ondansetron (ZOFRAN) injection 4 mg (not administered)  sodium chloride 0.9 % bolus 1,000 mL (not administered)     Initial Impression / Assessment and Plan / ED Course  I have reviewed the triage vital signs and the nursing notes.  Pertinent labs & imaging results that were available during my care of the patient were reviewed by me and considered in my medical decision making (see chart for details).  Iv ns. Ecg. Labs.  Reviewed nursing notes and prior charts for additional history.   Initial  trop neg.  Will try pepcid and maalox as need for symptom relief.  Recheck pt comfortable. No distress. abd soft nt.   Pt indicates her normal bp is in the 100/70 range.   Recheck no increased wob or cp.   Discussed diff dx, esophageal spasm, musculoskeletal cp, angina, gerd, biliary colic, etc.   Awaiting delta trop.  After symptoms persistent since awakening, initial and delta trop normal - workup not c/w acs.   rec close outpt pcp f/u.    Return precautions provided.    Final Clinical Impressions(s) / ED Diagnoses   Final diagnoses:  None    ED Discharge Orders    None       Lajean Saver, MD 07/05/17 [facility]    Lajean Saver, MD 08/05/17 [phone]

## 2017-07-09 ENCOUNTER — Ambulatory Visit
Admission: RE | Admit: 2017-07-09 | Discharge: 2017-07-09 | Disposition: A | Discharge status: Home / Self Care | Payer: BLUE CROSS/BLUE SHIELD | Source: Ambulatory Visit | Attending: Otolaryngology | Admitting: Otolaryngology

## 2017-07-09 DIAGNOSIS — H93A2 Pulsatile tinnitus, left ear: Secondary | ICD-10-CM

## 2017-07-09 MED ORDER — IOPAMIDOL (ISOVUE-370) INJECTION 76%
75.0000 mL | Freq: Once | INTRAVENOUS | Status: AC | PRN
  Administered 2017-07-09: 75 mL via INTRAVENOUS

## 2017-07-10 ENCOUNTER — Other Ambulatory Visit: Payer: Self-pay | Admitting: Gastroenterology

## 2017-07-10 DIAGNOSIS — R112 Nausea with vomiting, unspecified: Secondary | ICD-10-CM

## 2017-07-10 DIAGNOSIS — R110 Nausea: Secondary | ICD-10-CM | POA: Diagnosis not present

## 2017-07-10 DIAGNOSIS — R079 Chest pain, unspecified: Secondary | ICD-10-CM | POA: Diagnosis not present

## 2017-07-10 DIAGNOSIS — Z800 Family history of malignant neoplasm of digestive organs: Secondary | ICD-10-CM | POA: Diagnosis not present

## 2017-07-10 DIAGNOSIS — R1011 Right upper quadrant pain: Secondary | ICD-10-CM | POA: Diagnosis not present

## 2017-07-16 ENCOUNTER — Ambulatory Visit (HOSPITAL_COMMUNITY)
Admission: RE | Admit: 2017-07-16 | Discharge: 2017-07-16 | Disposition: A | Discharge status: Home / Self Care | Payer: BLUE CROSS/BLUE SHIELD | Source: Ambulatory Visit | Attending: Gastroenterology | Admitting: Gastroenterology

## 2017-07-16 DIAGNOSIS — R112 Nausea with vomiting, unspecified: Secondary | ICD-10-CM | POA: Diagnosis not present

## 2017-07-16 DIAGNOSIS — R1011 Right upper quadrant pain: Secondary | ICD-10-CM | POA: Diagnosis not present

## 2017-07-16 DIAGNOSIS — K8020 Calculus of gallbladder without cholecystitis without obstruction: Secondary | ICD-10-CM | POA: Diagnosis not present

## 2017-07-24 ENCOUNTER — Other Ambulatory Visit (HOSPITAL_COMMUNITY): Payer: BLUE CROSS/BLUE SHIELD

## 2017-07-24 ENCOUNTER — Ambulatory Visit (HOSPITAL_COMMUNITY): Payer: BLUE CROSS/BLUE SHIELD

## 2017-08-04 DIAGNOSIS — L821 Other seborrheic keratosis: Secondary | ICD-10-CM | POA: Diagnosis not present

## 2017-08-04 DIAGNOSIS — L820 Inflamed seborrheic keratosis: Secondary | ICD-10-CM | POA: Diagnosis not present

## 2017-08-04 DIAGNOSIS — D2239 Melanocytic nevi of other parts of face: Secondary | ICD-10-CM | POA: Diagnosis not present

## 2017-08-04 DIAGNOSIS — D224 Melanocytic nevi of scalp and neck: Secondary | ICD-10-CM | POA: Diagnosis not present

## 2017-08-04 DIAGNOSIS — D2261 Melanocytic nevi of right upper limb, including shoulder: Secondary | ICD-10-CM | POA: Diagnosis not present

## 2017-08-14 ENCOUNTER — Other Ambulatory Visit: Payer: Self-pay | Admitting: Surgery

## 2017-08-14 DIAGNOSIS — K8020 Calculus of gallbladder without cholecystitis without obstruction: Secondary | ICD-10-CM | POA: Diagnosis not present

## 2017-10-07 DIAGNOSIS — Z6823 Body mass index (BMI) 23.0-23.9, adult: Secondary | ICD-10-CM | POA: Diagnosis not present

## 2017-10-07 DIAGNOSIS — Z13220 Encounter for screening for lipoid disorders: Secondary | ICD-10-CM | POA: Diagnosis not present

## 2017-10-07 DIAGNOSIS — Z1329 Encounter for screening for other suspected endocrine disorder: Secondary | ICD-10-CM | POA: Diagnosis not present

## 2017-10-07 DIAGNOSIS — Z01419 Encounter for gynecological examination (general) (routine) without abnormal findings: Secondary | ICD-10-CM | POA: Diagnosis not present

## 2017-10-07 DIAGNOSIS — Z130 Encounter for screening for diseases of the blood and blood-forming organs and certain disorders involving the immune mechanism: Secondary | ICD-10-CM | POA: Diagnosis not present

## 2017-10-07 DIAGNOSIS — R3129 Other microscopic hematuria: Secondary | ICD-10-CM | POA: Diagnosis not present

## 2017-10-07 DIAGNOSIS — Z1231 Encounter for screening mammogram for malignant neoplasm of breast: Secondary | ICD-10-CM | POA: Diagnosis not present

## 2017-10-07 DIAGNOSIS — Z0000 Encounter for general adult medical examination without abnormal findings: Secondary | ICD-10-CM | POA: Diagnosis not present

## 2017-10-29 ENCOUNTER — Encounter (HOSPITAL_COMMUNITY): Payer: Self-pay

## 2017-10-29 NOTE — Patient Instructions (Addendum)
Your procedure is scheduled on: Wednesday, November 05, 2017   Surgery Time:  8:30AM-9:30AM   Report to [facility] Main  Entrance    Report to admitting at 6:30 AM   Call this number if you have problems the morning of surgery [phone]   Do not eat food or drink liquids :After Midnight.   Do NOT smoke after Midnight   Take these medicines the morning of surgery with A SIP OF WATER: None                               You may not have any metal on your body including hair pins, jewelry, and body piercings             Do not wear make-up, lotions, powders, perfumes/cologne, or deodorant             Do not wear nail polish.  Do not shave  48 hours prior to surgery.                 Do not bring valuables to the hospital. [place].   Contacts, dentures or bridgework may not be worn into surgery.    Patients discharged the day of surgery will not be allowed to drive home.   Name and phone number of your driver:   Special Instructions: Bring a copy of your healthcare power of attorney and living will documents         the day of surgery if you haven't scanned them in before.              Please read over the following fact sheets you were given:  [facility] - Preparing for Surgery Before surgery, you can play an important role.  Because skin is not sterile, your skin needs to be as free of germs as possible.  You can reduce the number of germs on your skin by washing with CHG (chlorahexidine gluconate) soap before surgery.  CHG is an antiseptic cleaner which kills germs and bonds with the skin to continue killing germs even after washing. Please DO NOT use if you have an allergy to CHG or antibacterial soaps.  If your skin becomes reddened/irritated stop using the CHG and inform your nurse when you arrive at Short Stay. Do not shave (including legs and underarms) for at least 48 hours prior to the first CHG shower.  You  may shave your face/neck.  Please follow these instructions carefully:  1.  Shower with CHG Soap the night before surgery and the  morning of surgery.  2.  If you choose to wash your hair, wash your hair first as usual with your normal  shampoo.  3.  After you shampoo, rinse your hair and body thoroughly to remove the shampoo.                             4.  Use CHG as you would any other liquid soap.  You can apply chg directly to the skin and wash.  Gently with a scrungie or clean washcloth.  5.  Apply the CHG Soap to your body ONLY FROM THE NECK DOWN.   Do   not use on face/ open  Wound or open sores. Avoid contact with eyes, ears mouth and   genitals (private parts).                       Wash face,  Genitals (private parts) with your normal soap.             6.  Wash thoroughly, paying special attention to the area where your    surgery  will be performed.  7.  Thoroughly rinse your body with warm water from the neck down.  8.  DO NOT shower/wash with your normal soap after using and rinsing off the CHG Soap.                9.  Pat yourself dry with a clean towel.            10.  Wear clean pajamas.            11.  Place clean sheets on your bed the night of your first shower and do not  sleep with pets. Day of Surgery : Do not apply any lotions/deodorants the morning of surgery.  Please wear clean clothes to the hospital/surgery center.  FAILURE TO FOLLOW THESE INSTRUCTIONS MAY RESULT IN THE CANCELLATION OF YOUR SURGERY  PATIENT SIGNATURE_________________________________  NURSE SIGNATURE__________________________________  ________________________________________________________________________

## 2017-10-29 NOTE — Pre-Procedure Instructions (Signed)
The following are in epic: EKG 07/07/17 Last office visit note Dr. Keturah Barre. Opalski 10/08/16

## 2017-10-31 ENCOUNTER — Other Ambulatory Visit: Payer: Self-pay

## 2017-10-31 ENCOUNTER — Encounter (HOSPITAL_COMMUNITY): Payer: Self-pay

## 2017-10-31 ENCOUNTER — Encounter (HOSPITAL_COMMUNITY)
Admission: RE | Admit: 2017-10-31 | Discharge: 2017-10-31 | Disposition: A | Discharge status: Home / Self Care | Payer: BLUE CROSS/BLUE SHIELD | Source: Ambulatory Visit | Attending: Surgery | Admitting: Surgery

## 2017-10-31 DIAGNOSIS — K8080 Other cholelithiasis without obstruction: Secondary | ICD-10-CM | POA: Insufficient documentation

## 2017-10-31 DIAGNOSIS — Z01812 Encounter for preprocedural laboratory examination: Secondary | ICD-10-CM | POA: Insufficient documentation

## 2017-10-31 HISTORY — DX: Personal history of other endocrine, nutritional and metabolic disease: Z86.39

## 2017-10-31 HISTORY — DX: Personal history of other diseases of the digestive system: Z87.19

## 2017-10-31 HISTORY — DX: Tinnitus, left ear: H93.12

## 2017-10-31 HISTORY — DX: Palpitations: R00.2

## 2017-10-31 HISTORY — DX: Calculus of gallbladder without cholecystitis without obstruction: K80.20

## 2017-10-31 HISTORY — DX: Scoliosis, unspecified: M41.9

## 2017-10-31 HISTORY — DX: Localized swelling, mass and lump, trunk: R22.2

## 2017-10-31 HISTORY — DX: Personal history of other diseases of urinary system: Z87.448

## 2017-10-31 LAB — CBC
HCT: 37.8 % (ref 36.0–46.0)
HEMOGLOBIN: 12.2 g/dL (ref 12.0–15.0)
MCH: 27.9 pg (ref 26.0–34.0)
MCHC: 32.3 g/dL (ref 30.0–36.0)
MCV: 86.5 fL (ref 78.0–100.0)
PLATELETS: 310 10*3/uL (ref 150–400)
RBC: 4.37 MIL/uL (ref 3.87–5.11)
RDW: 14.2 % (ref 11.5–15.5)
WBC: 7.3 10*3/uL (ref 4.0–10.5)

## 2017-11-04 NOTE — H&P (Signed)
Jennifer Petersen  Location: [facility] Surgery Patient #: [id number] DOB: 1971-03-23 Single / Language: Jennifer Petersen / Race: White Female   History of Present Illness (Edoardo Laforte A. Ninfa Linden MD;  The patient is a 47 year old female who presents for evaluation of gall stones. This is a pleasant patient referred by Dr. Juanita Craver for symptomatic gallstones. She presented to the emergency department on March 2 with epigastric abdominal pain hurting into the chest. She had nausea with this and throughout in the emergency department. She then improved and then follow up with Dr. Collene Mares who ordered an ultrasound showing her to have multiple gallstones. She does still feel some bloating and belching as well as discomfort at her epigastrium. The pain was described as sharp and moderate in intensity. She denied jaundice. Her liver function tests were normal at that time. The ultrasound showed multiple stones but no gallbladder wall thickening and the bile duct was normal. The pain did not refer anywhere else. She has had some light-colored bowel movements   Allergies Malachi Bonds, CMA;   No Known Drug Allergies   Medication History Malachi Bonds, CMA;  No Current Medications Medications Reconciled    Review of Systems Malachi Bonds CMA;  General Present- Fatigue. Not Present- Appetite Loss, Chills, Fever, Night Sweats, Weight Gain and Weight Loss. Skin Not Present- Change in Wart/Mole, Dryness, Hives, Jaundice, New Lesions, Non-Healing Wounds, Rash and Ulcer. HEENT Present- Ringing in the Ears. Not Present- Earache, Hearing Loss, Hoarseness, Nose Bleed, Oral Ulcers, Seasonal Allergies, Sinus Pain, Sore Throat, Visual Disturbances, Wears glasses/contact lenses and Yellow Eyes. Respiratory Not Present- Bloody sputum, Chronic Cough, Difficulty Breathing, Snoring and Wheezing. Breast Not Present- Breast Mass, Breast Pain, Nipple Discharge and Skin Changes. Cardiovascular Not Present- Chest  Pain, Difficulty Breathing Lying Down, Leg Cramps, Palpitations, Rapid Heart Rate, Shortness of Breath and Swelling of Extremities. Gastrointestinal Present- Gets full quickly at meals. Not Present- Abdominal Pain, Bloating, Bloody Stool, Change in Bowel Habits, Chronic diarrhea, Constipation, Difficulty Swallowing, Excessive gas, Hemorrhoids, Indigestion, Nausea, Rectal Pain and Vomiting. Female Genitourinary Not Present- Frequency, Nocturia, Painful Urination, Pelvic Pain and Urgency. Musculoskeletal Not Present- Back Pain, Joint Pain, Joint Stiffness, Muscle Pain, Muscle Weakness and Swelling of Extremities. Neurological Not Present- Decreased Memory, Fainting, Headaches, Numbness, Seizures, Tingling, Tremor, Trouble walking and Weakness. Psychiatric Not Present- Anxiety, Bipolar, Change in Sleep Pattern, Depression, Fearful and Frequent crying. Endocrine Not Present- Cold Intolerance, Excessive Hunger, Hair Changes, Heat Intolerance, Hot flashes and New Diabetes. Hematology Not Present- Blood Thinners, Easy Bruising, Excessive bleeding, Gland problems, HIV and Persistent Infections.  Vitals   Weight: 162.8 lb Height: 69in Body Surface Area: 1.89 m Body Mass Index: 24.04 kg/m  Temp.: 28F(Oral)  Pulse: 73 (Regular)  BP: 112/76 (Sitting, Left Arm, Standard)     Physical Exam (Ayumi Wangerin A. Ninfa Linden MD;  General Mental Status-Alert. General Appearance-Consistent with stated age. Hydration-Well hydrated. Voice-Normal.  Head and Neck Head-normocephalic, atraumatic with no lesions or palpable masses.  Eye Eyeball - Bilateral-Extraocular movements intact. Sclera/Conjunctiva - Bilateral-No scleral icterus.  Chest and Lung Exam Chest and lung exam reveals -quiet, even and easy respiratory effort with no use of accessory muscles and on auscultation, normal breath sounds, no adventitious sounds and normal vocal resonance. Inspection Chest Wall - Normal. Back -  normal.  Cardiovascular Cardiovascular examination reveals -on palpation PMI is normal in location and amplitude, no palpable S3 or S4. Normal cardiac borders., normal heart sounds, regular rate and rhythm with no murmurs, carotid auscultation reveals no bruits and  normal pedal pulses bilaterally.  Abdomen Inspection Inspection of the abdomen reveals - No Hernias. Skin - Scar - no surgical scars. Palpation/Percussion Palpation and Percussion of the abdomen reveal - Soft, No Rebound tenderness, No Rigidity (guarding) and No hepatosplenomegaly. Tenderness - Epigastrium. Auscultation Auscultation of the abdomen reveals - Bowel sounds normal.  Neurologic - Did not examine.  Musculoskeletal - Did not examine.    Assessment & Plan   SYMPTOMATIC CHOLELITHIASIS (K80.20)  Impression: I had a long discussion with patient regarding gallstones. I do suspect this was an attack of symptomatic cholelithiasis. I recommend we proceed with a laparoscopic cholecystectomy. We discussed potential complications of gallstones without proceeding with surgery. It is unpredictable what she could have another attack. I discussed the surgical procedure in detail. I discussed the risk of the surgery which includes but is not limited to bleeding, infection, bile duct injury, bile leak, the need to convert to an open procedure, injury to surrounding structures, cardiopulmonary she's, DVT, postoperative recovery, etc. She understands and wished to proceed with surgery will be scheduled

## 2017-11-04 NOTE — Anesthesia Preprocedure Evaluation (Addendum)
Anesthesia Evaluation  Patient identified by MRN, date of birth, ID band Patient awake    Reviewed: Allergy & Precautions, NPO status , Patient's Chart, lab work & pertinent test results  Airway Mallampati: II  TM Distance: >3 FB Neck ROM: Full    Dental no notable dental hx. (+) Teeth Intact, Dental Advisory Given   Pulmonary neg pulmonary ROS,    Pulmonary exam normal breath sounds clear to auscultation       Cardiovascular negative cardio ROS Normal cardiovascular exam Rhythm:Regular Rate:Normal     Neuro/Psych negative neurological ROS  negative psych ROS   GI/Hepatic negative GI ROS, Neg liver ROS,   Endo/Other    Renal/GU negative Renal ROS     Musculoskeletal   Abdominal   Peds  Hematology   Anesthesia Other Findings   Reproductive/Obstetrics negative OB ROS                            Lab Results  Component Value Date   WBC 7.3 10/31/2017   HGB 12.2 10/31/2017   HCT 37.8 10/31/2017   MCV 86.5 10/31/2017   PLT 310 10/31/2017    Anesthesia Physical Anesthesia Plan  ASA: I  Anesthesia Plan: General   Post-op Pain Management:    Induction: Intravenous  PONV Risk Score and Plan: 4 or greater and Treatment may vary due to age or medical condition, Ondansetron, Dexamethasone and Scopolamine patch - Pre-op  Airway Management Planned: Oral ETT  Additional Equipment:   Intra-op Plan:   Post-operative Plan: Extubation in OR  Informed Consent: I have reviewed the patients History and Physical, chart, labs and discussed the procedure including the risks, benefits and alternatives for the proposed anesthesia with the patient or authorized representative who has indicated his/her understanding and acceptance.   Dental advisory given  Plan Discussed with: CRNA  Anesthesia Plan Comments:         Anesthesia Quick Evaluation

## 2017-11-05 ENCOUNTER — Ambulatory Visit (HOSPITAL_COMMUNITY)
Admission: RE | Admit: 2017-11-05 | Discharge: 2017-11-05 | Disposition: A | Discharge status: Home / Self Care | Payer: BLUE CROSS/BLUE SHIELD | Source: Ambulatory Visit | Attending: Surgery | Admitting: Surgery

## 2017-11-05 ENCOUNTER — Encounter (HOSPITAL_COMMUNITY): Payer: Self-pay | Admitting: Registered Nurse

## 2017-11-05 ENCOUNTER — Ambulatory Visit (HOSPITAL_COMMUNITY): Payer: BLUE CROSS/BLUE SHIELD | Admitting: Anesthesiology

## 2017-11-05 ENCOUNTER — Encounter (HOSPITAL_COMMUNITY)
Admission: RE | Disposition: A | Discharge status: Home / Self Care | Payer: Self-pay | Source: Ambulatory Visit | Attending: Surgery

## 2017-11-05 DIAGNOSIS — K8010 Calculus of gallbladder with chronic cholecystitis without obstruction: Secondary | ICD-10-CM | POA: Diagnosis not present

## 2017-11-05 DIAGNOSIS — K824 Cholesterolosis of gallbladder: Secondary | ICD-10-CM | POA: Diagnosis not present

## 2017-11-05 DIAGNOSIS — K8020 Calculus of gallbladder without cholecystitis without obstruction: Secondary | ICD-10-CM | POA: Diagnosis not present

## 2017-11-05 HISTORY — PX: CHOLECYSTECTOMY: SHX55

## 2017-11-05 LAB — PREGNANCY, URINE: PREG TEST UR: NEGATIVE

## 2017-11-05 SURGERY — LAPAROSCOPIC CHOLECYSTECTOMY
Anesthesia: General | Site: Abdomen

## 2017-11-05 MED ORDER — EPHEDRINE SULFATE 50 MG/ML IJ SOLN
INTRAMUSCULAR | Status: DC | PRN
  Administered 2017-11-05: 10 mg via INTRAVENOUS

## 2017-11-05 MED ORDER — SUGAMMADEX SODIUM 200 MG/2ML IV SOLN
INTRAVENOUS | Status: DC | PRN
  Administered 2017-11-05: 200 mg via INTRAVENOUS

## 2017-11-05 MED ORDER — PROPOFOL 10 MG/ML IV BOLUS
INTRAVENOUS | Status: AC
  Filled 2017-11-05: qty 20

## 2017-11-05 MED ORDER — GLYCOPYRROLATE PF 0.2 MG/ML IJ SOSY
PREFILLED_SYRINGE | INTRAMUSCULAR | Status: DC | PRN
  Administered 2017-11-05: .2 mg via INTRAVENOUS

## 2017-11-05 MED ORDER — DEXAMETHASONE SODIUM PHOSPHATE 10 MG/ML IJ SOLN
INTRAMUSCULAR | Status: AC
  Filled 2017-11-05: qty 1

## 2017-11-05 MED ORDER — ONDANSETRON HCL 4 MG/2ML IJ SOLN
INTRAMUSCULAR | Status: DC | PRN
  Administered 2017-11-05: 4 mg via INTRAVENOUS

## 2017-11-05 MED ORDER — ACETAMINOPHEN 500 MG PO TABS
1000.0000 mg | ORAL_TABLET | Freq: Once | ORAL | Status: AC
  Administered 2017-11-05: 1000 mg via ORAL
  Filled 2017-11-05: qty 2

## 2017-11-05 MED ORDER — MIDAZOLAM HCL 2 MG/2ML IJ SOLN
INTRAMUSCULAR | Status: AC
  Filled 2017-11-05: qty 2

## 2017-11-05 MED ORDER — CHLORHEXIDINE GLUCONATE CLOTH 2 % EX PADS: 6.0000 | MEDICATED_PAD | Freq: Once | CUTANEOUS | Status: DC

## 2017-11-05 MED ORDER — LACTATED RINGERS IV SOLN
INTRAVENOUS | Status: DC | PRN
  Administered 2017-11-05: 07:00:00 via INTRAVENOUS

## 2017-11-05 MED ORDER — CLONIDINE HCL 0.2 MG PO TABS
0.2000 mg | ORAL_TABLET | Freq: Once | ORAL | Status: AC
  Administered 2017-11-05: 0.2 mg via ORAL
  Filled 2017-11-05: qty 1

## 2017-11-05 MED ORDER — LIDOCAINE 2% (20 MG/ML) 5 ML SYRINGE
INTRAMUSCULAR | Status: DC | PRN
  Administered 2017-11-05: 75 mg via INTRAVENOUS
  Administered 2017-11-05: 25 mg via INTRAVENOUS

## 2017-11-05 MED ORDER — LACTATED RINGERS IV SOLN
INTRAVENOUS | Status: AC | PRN
  Administered 2017-11-05: 1000 mL

## 2017-11-05 MED ORDER — CEFAZOLIN SODIUM-DEXTROSE 2-4 GM/100ML-% IV SOLN
2.0000 g | INTRAVENOUS | Status: AC
  Administered 2017-11-05: 2 g via INTRAVENOUS
  Filled 2017-11-05: qty 100

## 2017-11-05 MED ORDER — DEXAMETHASONE SODIUM PHOSPHATE 10 MG/ML IJ SOLN
INTRAMUSCULAR | Status: DC | PRN
  Administered 2017-11-05: 10 mg via INTRAVENOUS

## 2017-11-05 MED ORDER — BUPIVACAINE HCL (PF) 0.5 % IJ SOLN
INTRAMUSCULAR | Status: DC | PRN
  Administered 2017-11-05: 20 mL

## 2017-11-05 MED ORDER — KETOROLAC TROMETHAMINE 30 MG/ML IJ SOLN
INTRAMUSCULAR | Status: DC | PRN
  Administered 2017-11-05: 30 mg via INTRAVENOUS

## 2017-11-05 MED ORDER — KETOROLAC TROMETHAMINE 30 MG/ML IJ SOLN
INTRAMUSCULAR | Status: AC
  Filled 2017-11-05: qty 1

## 2017-11-05 MED ORDER — FENTANYL CITRATE (PF) 250 MCG/5ML IJ SOLN
INTRAMUSCULAR | Status: AC
  Filled 2017-11-05: qty 5

## 2017-11-05 MED ORDER — OXYCODONE HCL 5 MG PO TABS: 5.0000 mg | ORAL_TABLET | Freq: Four times a day (QID) | ORAL | 0 refills | Status: DC | PRN

## 2017-11-05 MED ORDER — ROCURONIUM BROMIDE 100 MG/10ML IV SOLN
INTRAVENOUS | Status: AC
  Filled 2017-11-05: qty 1

## 2017-11-05 MED ORDER — PROPOFOL 10 MG/ML IV BOLUS
INTRAVENOUS | Status: DC | PRN
  Administered 2017-11-05: 200 mg via INTRAVENOUS

## 2017-11-05 MED ORDER — BUPIVACAINE HCL (PF) 0.5 % IJ SOLN
INTRAMUSCULAR | Status: AC
  Filled 2017-11-05: qty 30

## 2017-11-05 MED ORDER — 0.9 % SODIUM CHLORIDE (POUR BTL) OPTIME
TOPICAL | Status: DC | PRN
  Administered 2017-11-05: 1000 mL

## 2017-11-05 MED ORDER — GABAPENTIN 300 MG PO CAPS
300.0000 mg | ORAL_CAPSULE | Freq: Once | ORAL | Status: AC
  Administered 2017-11-05: 300 mg via ORAL
  Filled 2017-11-05: qty 1

## 2017-11-05 MED ORDER — LACTATED RINGERS IV SOLN
INTRAVENOUS | Status: DC
  Administered 2017-11-05 (×2): via INTRAVENOUS

## 2017-11-05 MED ORDER — HYDROMORPHONE HCL 2 MG/ML IJ SOLN
INTRAMUSCULAR | Status: AC
  Filled 2017-11-05: qty 1

## 2017-11-05 MED ORDER — ONDANSETRON HCL 4 MG/2ML IJ SOLN
INTRAMUSCULAR | Status: AC
  Filled 2017-11-05: qty 2

## 2017-11-05 MED ORDER — ROCURONIUM BROMIDE 10 MG/ML (PF) SYRINGE
PREFILLED_SYRINGE | INTRAVENOUS | Status: DC | PRN
  Administered 2017-11-05: 35 mg via INTRAVENOUS

## 2017-11-05 MED ORDER — HYDROMORPHONE HCL 1 MG/ML IJ SOLN
INTRAMUSCULAR | Status: DC | PRN
  Administered 2017-11-05: 1 mg via INTRAVENOUS

## 2017-11-05 SURGICAL SUPPLY — 29 items
ADH SKN CLS APL DERMABOND .7 (GAUZE/BANDAGES/DRESSINGS) ×1
APPLIER CLIP 5 13 M/L LIGAMAX5 (MISCELLANEOUS) ×2
APR CLP MED LRG 5 ANG JAW (MISCELLANEOUS) ×1
BAG SPEC RTRVL LRG 6X4 10 (ENDOMECHANICALS) ×1
CABLE HIGH FREQUENCY MONO STRZ (ELECTRODE) ×2 IMPLANT
CHLORAPREP W/TINT 26ML (MISCELLANEOUS) ×2 IMPLANT
CLIP APPLIE 5 13 M/L LIGAMAX5 (MISCELLANEOUS) ×1 IMPLANT
COVER MAYO STAND STRL (DRAPES) IMPLANT
DECANTER SPIKE VIAL GLASS SM (MISCELLANEOUS) ×2 IMPLANT
DERMABOND ADVANCED (GAUZE/BANDAGES/DRESSINGS) ×1
DERMABOND ADVANCED .7 DNX12 (GAUZE/BANDAGES/DRESSINGS) ×1 IMPLANT
DRAPE C-ARM 42X120 X-RAY (DRAPES) IMPLANT
ELECT REM PT RETURN 15FT ADLT (MISCELLANEOUS) ×2 IMPLANT
GLOVE SURG SIGNA 7.5 PF LTX (GLOVE) ×2 IMPLANT
GOWN STRL REUS W/TWL XL LVL3 (GOWN DISPOSABLE) ×4 IMPLANT
HEMOSTAT SURGICEL 4X8 (HEMOSTASIS) IMPLANT
KIT BASIN OR (CUSTOM PROCEDURE TRAY) ×2 IMPLANT
POUCH SPECIMEN RETRIEVAL 10MM (ENDOMECHANICALS) ×2 IMPLANT
SCISSORS LAP 5X35 DISP (ENDOMECHANICALS) ×2 IMPLANT
SET CHOLANGIOGRAPH MIX (MISCELLANEOUS) IMPLANT
SET IRRIG TUBING LAPAROSCOPIC (IRRIGATION / IRRIGATOR) ×2 IMPLANT
SLEEVE XCEL OPT CAN 5 100 (ENDOMECHANICALS) ×4 IMPLANT
SUT MNCRL AB 4-0 PS2 18 (SUTURE) ×2 IMPLANT
TOWEL OR 17X26 10 PK STRL BLUE (TOWEL DISPOSABLE) ×2 IMPLANT
TOWEL OR NON WOVEN STRL DISP B (DISPOSABLE) ×2 IMPLANT
TRAY LAPAROSCOPIC (CUSTOM PROCEDURE TRAY) ×2 IMPLANT
TROCAR BLADELESS OPT 5 100 (ENDOMECHANICALS) ×2 IMPLANT
TROCAR XCEL BLUNT TIP 100MML (ENDOMECHANICALS) ×2 IMPLANT
TUBING INSUF HEATED (TUBING) ×2 IMPLANT

## 2017-11-05 NOTE — Interval H&P Note (Signed)
History and Physical Interval Note: no change in H and P  11/05/2017 8:08 AM  Jennifer Petersen  has presented today for surgery, with the diagnosis of Symptomatic gallstones  The various methods of treatment have been discussed with the patient and family. After consideration of risks, benefits and other options for treatment, the patient has consented to  Procedure(s): LAPAROSCOPIC CHOLECYSTECTOMY (N/A) as a surgical intervention .  The patient's history has been reviewed, patient examined, no change in status, stable for surgery.  I have reviewed the patient's chart and labs.  Questions were answered to the patient's satisfaction.     Audris Speaker A

## 2017-11-05 NOTE — Discharge Instructions (Signed)
[facility] ______[facility] LAPAROSCOPIC SURGERY: POST OP INSTRUCTIONS Always review your discharge instruction sheet given to you by the facility where your surgery was performed. IF YOU HAVE DISABILITY OR FAMILY LEAVE FORMS, YOU MUST BRING THEM TO THE OFFICE FOR PROCESSING.   DO NOT GIVE THEM TO YOUR DOCTOR.  1. A prescription for pain medication may be given to you upon discharge.  Take your pain medication as prescribed, if needed.  If narcotic pain medicine is not needed, then you may take acetaminophen (Tylenol) or ibuprofen (Advil) as needed. 2. Take your usually prescribed medications unless otherwise directed. 3. If you need a refill on your pain medication, please contact your pharmacy.  They will contact our office to request authorization. Prescriptions will not be filled after 5pm or on week-ends. 4. You should follow a light diet the first few days after arrival home, such as soup and crackers, etc.  Be sure to include lots of fluids daily. 5. Most patients will experience some swelling and bruising in the area of the incisions.  Ice packs will help.  Swelling and bruising can take several days to resolve.  6. It is common to experience some constipation if taking pain medication after surgery.  Increasing fluid intake and taking a stool softener (such as Colace) will usually help or prevent this problem from occurring.  A mild laxative (Milk of Magnesia or Miralax) should be taken according to package instructions if there are no bowel movements after 48 hours. 7. Unless discharge instructions indicate otherwise, you may remove your bandages 24-48 hours after surgery, and you may shower at that time.  You may have steri-strips (small skin tapes) in place directly over the incision.  These strips should be left on the skin for 7-10 days.  If your surgeon used skin glue on the incision, you may shower in 24 hours.  The glue will flake off over the next 2-3 weeks.  Any sutures or  staples will be removed at the office during your follow-up visit. 8. ACTIVITIES:  You may resume regular (light) daily activities beginning the next day--such as daily self-care, walking, climbing stairs--gradually increasing activities as tolerated.  You may have sexual intercourse when it is comfortable.  Refrain from any heavy lifting or straining until approved by your doctor. a. You may drive when you are no longer taking prescription pain medication, you can comfortably wear a seatbelt, and you can safely maneuver your car and apply brakes. b. RETURN TO WORK:  ____OK TO GO BACK THIS Friday IF YOU FEEL LIKE IT c. ______________________________________________________ 9. You should see your doctor in the office for a follow-up appointment approximately 2-3 weeks after your surgery.  Make sure that you call for this appointment within a day or two after you arrive home to insure a convenient appointment time. 10. OTHER INSTRUCTIONS:OK TO SHOWER STARTING TOMORROW 11. ICE PACK, TYLENOL, IBUPROFEN ALSO FOR PAIN 12. NO LIFTING MORE THAN 15 TO 20 POUNDS FOR 2 WEEKS __________________________________________________________________________________________________________________________ __________________________________________________________________________________________________________________________ WHEN TO CALL YOUR DOCTOR: 1. Fever over 101.0 2. Inability to urinate 3. Continued bleeding from incision. 4. Increased pain, redness, or drainage from the incision. 5. Increasing abdominal pain  The clinic staff is available to answer your questions during regular business hours.  Please dont hesitate to call and ask to speak to one of the nurses for clinical concerns.  If you have a medical emergency, go to the nearest emergency room or call 911.  A surgeon from [facility]  is always on call at the hospital. [address] [place], [place], [place]  [address] ? [address], [place], [place]   [address] [phone] ? [phone] ? FAX [phone] Web site: [email or web address]

## 2017-11-05 NOTE — Transfer of Care (Signed)
Immediate Anesthesia Transfer of Care Note  Patient: Jennifer Petersen  Procedure(s) Performed: LAPAROSCOPIC CHOLECYSTECTOMY (N/A Abdomen)  Patient Location: PACU  Anesthesia Type:General  Level of Consciousness: awake, alert , oriented and patient cooperative  Airway & Oxygen Therapy: Patient Spontanous Breathing and Patient connected to face mask oxygen  Post-op Assessment: Report given to RN, Post -op Vital signs reviewed and stable and Patient moving all extremities X 4  Post vital signs: stable  Last Vitals:  Vitals Value Taken Time  BP 125/71 11/05/2017  9:19 AM  Temp 36.7 C 11/05/2017  9:15 AM  Pulse 78 11/05/2017  9:22 AM  Resp 21 11/05/2017  9:22 AM  SpO2 100 % 11/05/2017  9:22 AM  Vitals shown include unvalidated device data.  Last Pain:  Vitals:   11/05/17 0658  TempSrc:   PainSc: 0-No pain         Complications: No apparent anesthesia complications

## 2017-11-05 NOTE — Anesthesia Postprocedure Evaluation (Signed)
Anesthesia Post Note  Patient: Jennifer Petersen  Procedure(s) Performed: LAPAROSCOPIC CHOLECYSTECTOMY (N/A Abdomen)     Patient location during evaluation: PACU Anesthesia Type: General Level of consciousness: awake and alert Pain management: pain level controlled Vital Signs Assessment: post-procedure vital signs reviewed and stable Respiratory status: spontaneous breathing, nonlabored ventilation, respiratory function stable and patient connected to nasal cannula oxygen Cardiovascular status: blood pressure returned to baseline and stable Postop Assessment: no apparent nausea or vomiting Anesthetic complications: no    Last Vitals:  Vitals:   11/05/17 1017 11/05/17 1056  BP: 106/78 94/72  Pulse: 70 (!) 53  Resp: 20 16  Temp: 37 C   SpO2: 99% 100%    Last Pain:  Vitals:   11/05/17 1056  TempSrc:   PainSc: 0-No pain                 Barnet Glasgow

## 2017-11-05 NOTE — Anesthesia Procedure Notes (Signed)
Procedure Name: Intubation Date/Time: 11/05/2017 8:30 AM Performed by: Lissa Morales, CRNA Pre-anesthesia Checklist: Patient identified, Emergency Drugs available, Suction available and Patient being monitored Patient Re-evaluated:Patient Re-evaluated prior to induction Oxygen Delivery Method: Circle system utilized Preoxygenation: Pre-oxygenation with 100% oxygen Induction Type: IV induction Ventilation: Mask ventilation without difficulty Laryngoscope Size: Mac and 3 Grade View: Grade I Tube type: Oral Tube size: 7.0 mm Number of attempts: 1 Airway Equipment and Method: Stylet and Oral airway Placement Confirmation: ETT inserted through vocal cords under direct vision,  positive ETCO2 and breath sounds checked- equal and bilateral Secured at: 21 cm Tube secured with: Tape Dental Injury: Teeth and Oropharynx as per pre-operative assessment

## 2017-11-05 NOTE — Op Note (Signed)

## 2017-11-06 ENCOUNTER — Encounter (HOSPITAL_COMMUNITY): Payer: Self-pay | Admitting: Surgery

## 2017-11-24 DIAGNOSIS — M419 Scoliosis, unspecified: Secondary | ICD-10-CM | POA: Diagnosis not present

## 2017-11-24 DIAGNOSIS — Z9049 Acquired absence of other specified parts of digestive tract: Secondary | ICD-10-CM | POA: Diagnosis not present

## 2017-11-24 DIAGNOSIS — Z800 Family history of malignant neoplasm of digestive organs: Secondary | ICD-10-CM | POA: Diagnosis not present

## 2017-11-24 DIAGNOSIS — Z6823 Body mass index (BMI) 23.0-23.9, adult: Secondary | ICD-10-CM | POA: Diagnosis not present

## 2017-11-24 DIAGNOSIS — Z1389 Encounter for screening for other disorder: Secondary | ICD-10-CM | POA: Diagnosis not present

## 2017-12-29 DIAGNOSIS — D2239 Melanocytic nevi of other parts of face: Secondary | ICD-10-CM | POA: Diagnosis not present

## 2017-12-29 DIAGNOSIS — D224 Melanocytic nevi of scalp and neck: Secondary | ICD-10-CM | POA: Diagnosis not present

## 2017-12-29 DIAGNOSIS — L720 Epidermal cyst: Secondary | ICD-10-CM | POA: Diagnosis not present

## 2017-12-29 DIAGNOSIS — L570 Actinic keratosis: Secondary | ICD-10-CM | POA: Diagnosis not present

## 2018-03-25 DIAGNOSIS — M25511 Pain in right shoulder: Secondary | ICD-10-CM | POA: Diagnosis not present

## 2019-06-25 ENCOUNTER — Ambulatory Visit: Payer: BC Managed Care – PPO | Attending: Internal Medicine

## 2019-06-25 DIAGNOSIS — Z23 Encounter for immunization: Secondary | ICD-10-CM | POA: Insufficient documentation

## 2019-06-25 NOTE — Progress Notes (Signed)
   Covid-19 Vaccination Clinic  Name:  Jennifer Petersen    MRN: [id number] DOB: 1970-10-01  06/25/2019  [name] was observed post Covid-19 immunization for 15 minutes without incidence. She was provided with Vaccine Information Sheet and instruction to access the V-Safe system.   [name] was instructed to call 911 with any severe reactions post vaccine: Marland Kitchen Difficulty breathing  . Swelling of your face and throat  . A fast heartbeat  . A bad rash all over your body  . Dizziness and weakness    Immunizations Administered    Name Date Dose VIS Date Route   Pfizer COVID-19 Vaccine 06/25/2019  4:05 PM 0.3 mL 04/16/2019 Intramuscular   Manufacturer: [place]   Lot: [id number]   [place]: [id number]

## 2019-07-20 ENCOUNTER — Ambulatory Visit: Payer: BC Managed Care – PPO | Attending: Internal Medicine

## 2019-07-20 DIAGNOSIS — Z23 Encounter for immunization: Secondary | ICD-10-CM

## 2019-07-20 NOTE — Progress Notes (Signed)
   Covid-19 Vaccination Clinic  Name:  Jennifer Petersen    MRN: [id number] DOB: [birth year]  07/20/2019  Jennifer Petersen was observed post Covid-19 immunization for 15 minutes without incident. She was provided with Vaccine Information Sheet and instruction to access the V-Safe system.   Jennifer Petersen was instructed to call 911 with any severe reactions post vaccine: Marland Kitchen Difficulty breathing  . Swelling of face and throat  . A fast heartbeat  . A bad rash all over body  . Dizziness and weakness   Immunizations Administered    Name Date Dose VIS Date Route   Pfizer COVID-19 Vaccine 07/20/2019  3:37 PM 0.3 mL 04/16/2019 Intramuscular   Manufacturer: [place]   Lot: [id number]   [place]: [id number]

## 2020-01-28 IMAGING — CT CT ANGIO HEAD
3 of 12 series · 15 of 47 positions shown · IV contrast (iopamidol)
Comparison: Neck CT 09/22/2009

CLINICAL DATA: 46 y/o female with pulsatile tinnitus of the left
ear for greater than 3 months.

EXAM:
CT ANGIOGRAPHY HEAD
TECHNIQUE: Multidetector CT imaging of the head was performed using the
standard protocol during bolus administration of intravenous
contrast. Multiplanar CT image reconstructions and MIPs were
obtained to evaluate the vascular anatomy.
CONTRAST:  75mL 75BSIN-UO0 IOPAMIDOL (75BSIN-UO0) INJECTION 76%

[Series 17: cta brain 1.00 hv48 s3 ax thin mips · axial · 0.42mm/px · z∈[-816,-693]mm · 9 of 155 slices shown]
[im 16/155  brain]
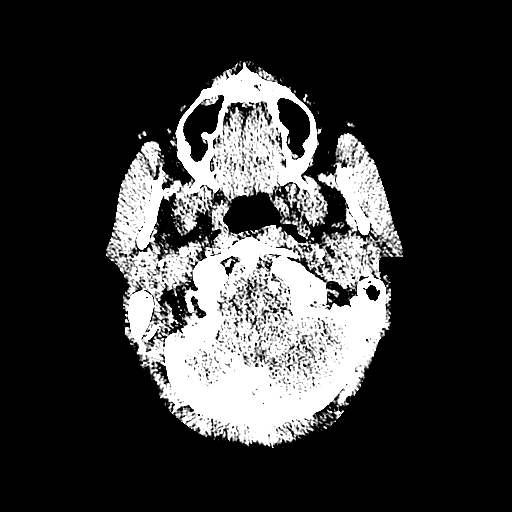
[im 31/155  bone]
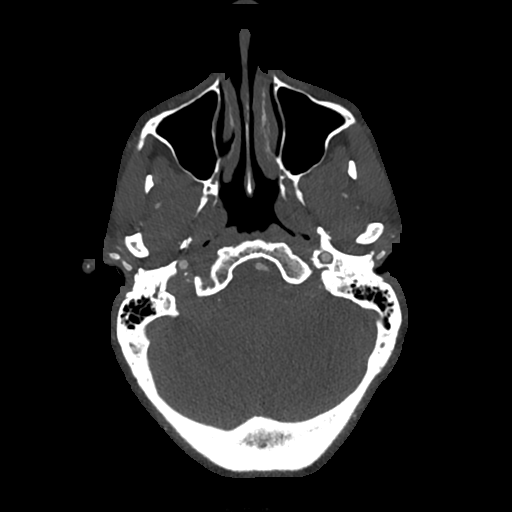
[im 47/155  brain]
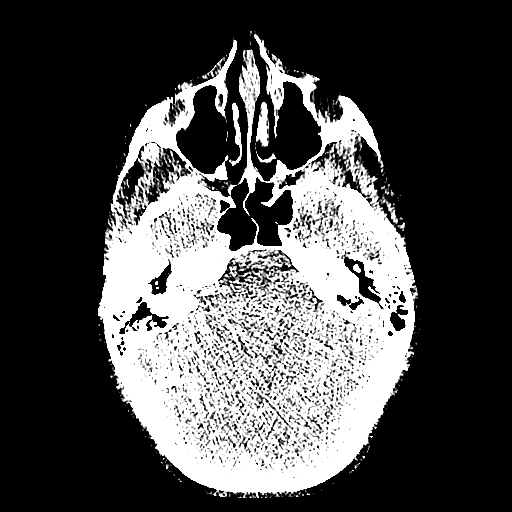
[im 62/155  bone]
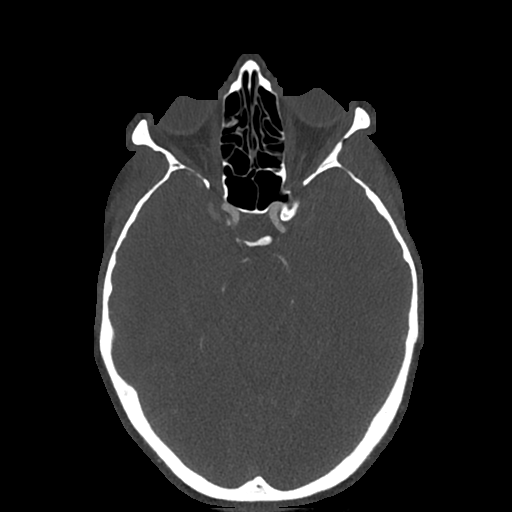
[im 78/155  brain]
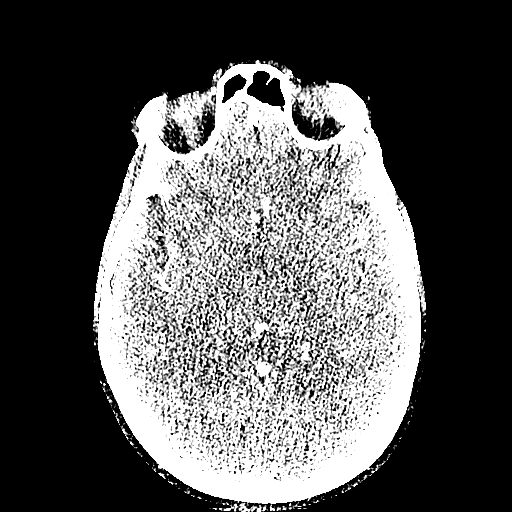
[im 93/155  bone]
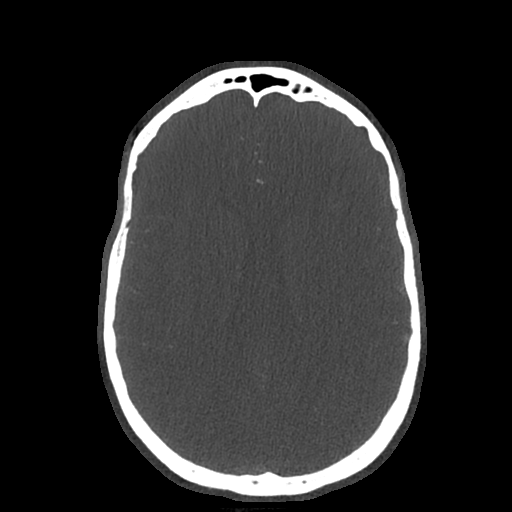
[im 108/155  brain]
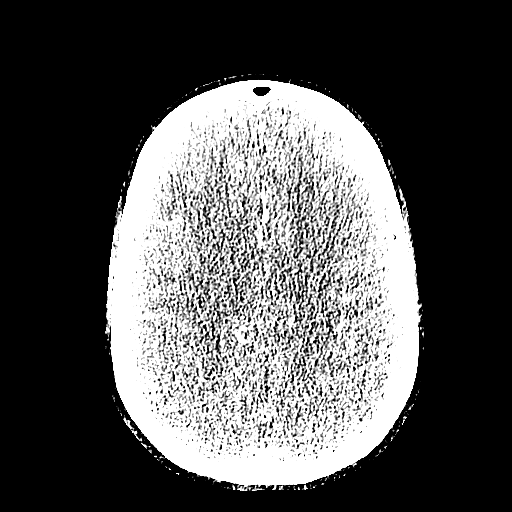
[im 124/155  bone]
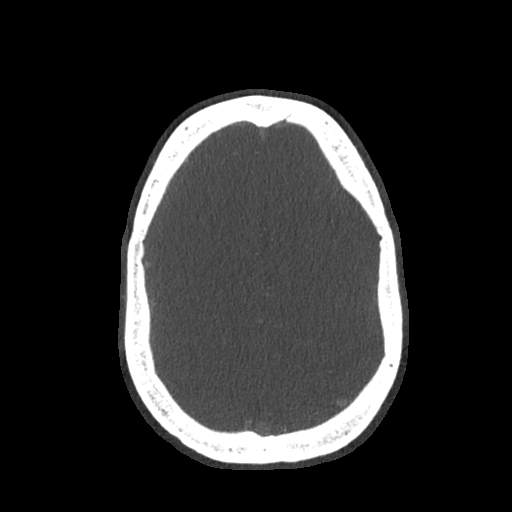
[im 139/155  brain]
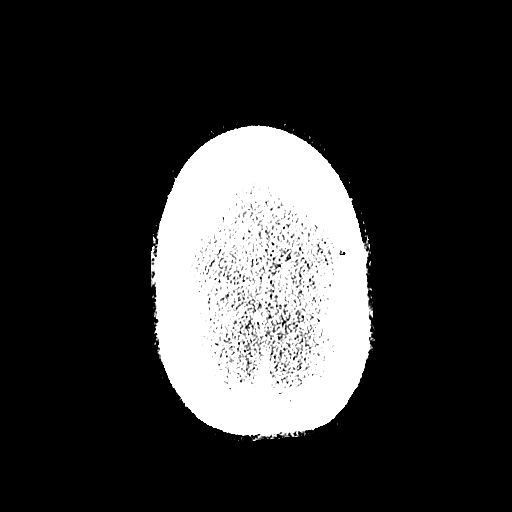

[Series 18: cta brain 1.00 hv48 s3 cor ax thin mips · coronal · 0.30mm/px · 3 of 217 slices shown]
[im 62/217  brain]
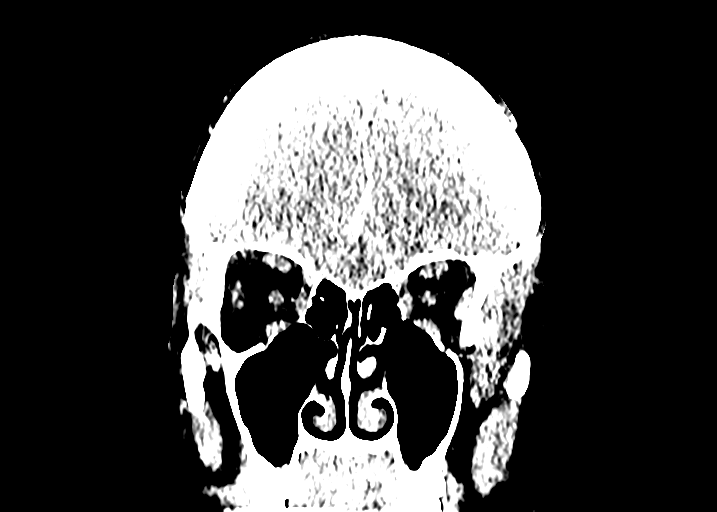
[im 93/217  brain]
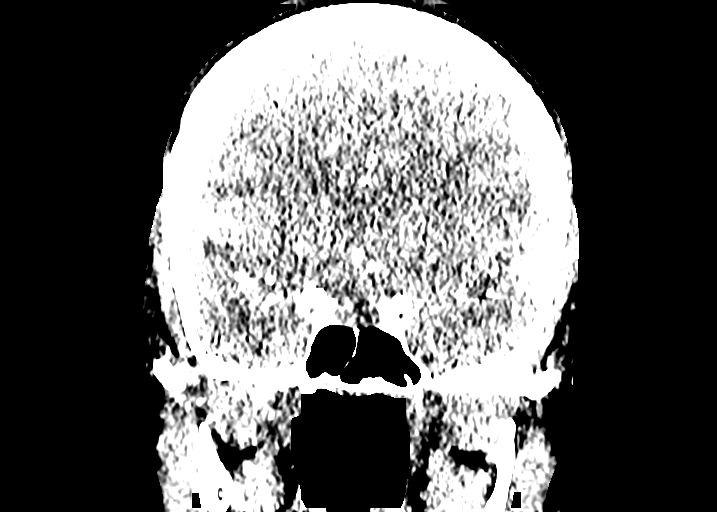
[im 124/217  brain]
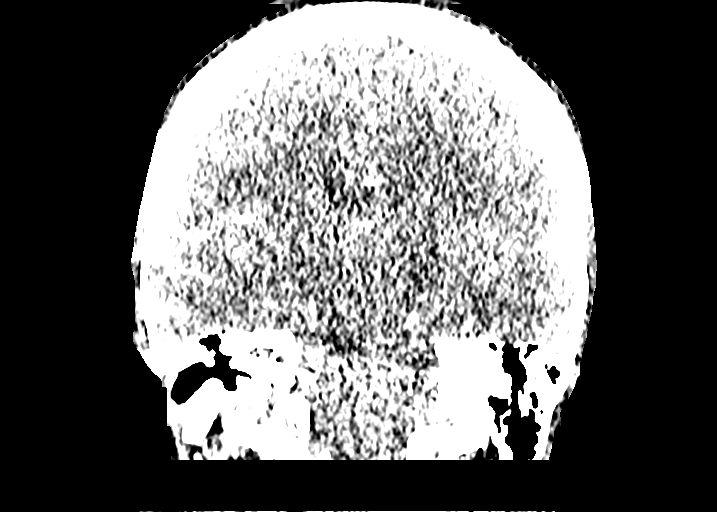

[Series 20: cta brain 1.00 hv48 s3 sag ax thin mips · sagittal · 0.30mm/px · 3 of 217 slices shown]
[im 44/217  brain]
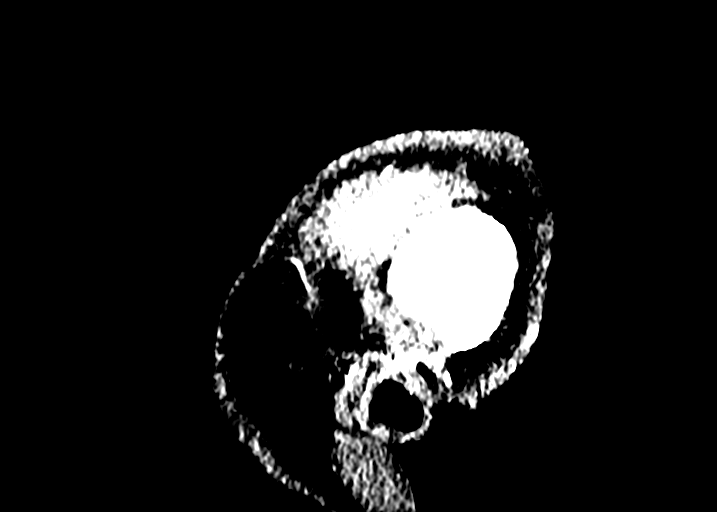
[im 87/217  brain]
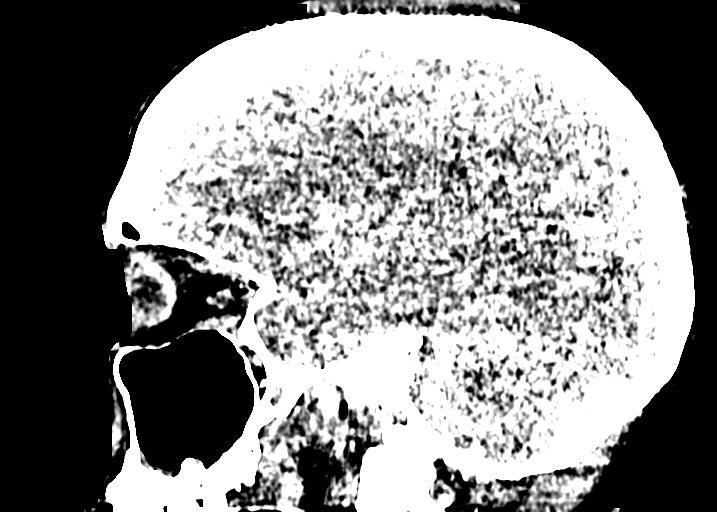
[im 130/217  brain]
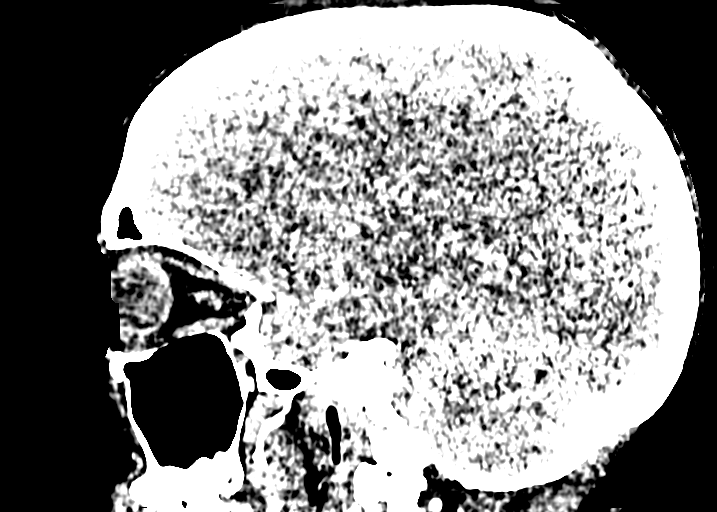

[15 of 47 positions shown; findings below may reference images not displayed]

FINDINGS: CT HEAD

Brain: Cerebral volume is within normal limits. No midline shift,
ventriculomegaly, mass effect, evidence of mass lesion, intracranial
hemorrhage or evidence of cortically based acute infarction.
Gray-white matter differentiation is within normal limits throughout
the brain.

Calvarium and skull base: Negative. The skull base appears stable
since 0200.

Paranasal sinuses: The bilateral tympanic cavities are clear. The
bilateral mastoid air cells remain clear. Paranasal sinuses are
stable and well pneumatized.

Orbits: Negative visible deep soft tissue spaces of the face. The
bilateral parotid spaces and periauricular soft tissues appear
stable since 0200 and normal.

CTA HEAD

Posterior circulation: Codominant distal vertebral arteries. The
distal vertebral arteries appear normal. Normal vertebrobasilar
junction. Normal left PICA origin. The right AICA appears dominant.
The vertebrobasilar junction and basilar arteries are patent without
stenosis. SCA and PCA origins appear normal. Both posterior
communicating arteries are diminutive. The bilateral PCA branches
are within normal limits.

No asymmetric or abnormal IAC or temporal bone region arterial
enhancement is identified.

Anterior circulation: The distal cervical ICAs appear normal. Both
ICA siphons are patent with no atherosclerosis or stenosis. Normal
ophthalmic and posterior communicating artery origins. Patent
carotid termini. Normal MCA and ACA origins. Anterior communicating
artery and bilateral ACA branches are within normal limits. The left
MCA M1 segment, bifurcation, and left MCA branches are within normal
limits. The right MCA M1 segment, bifurcation, and right MCA
branches are within normal limits.

Venous sinuses: Patent on the delayed images. The right sigmoid
sinus and right IJ are dominant.

Anatomic variants: Dominant appearing right AICA.

Delayed phase: No abnormal enhancement identified.

Review of the MIP images confirms the above findings
IMPRESSION: 1. Negative intracranial CTA.
No explanation for pulsatile tinnitus. Brain MRI without and with
contrast (IAC protocol) plus noncontrast Head MRA would be
complementary. Or alternatively, if a vascular etiology of the
pulsatile tinnitus is strongly suspected than a conventional
cerebral angiogram may be most valuable.
2.  Normal CT appearance of the brain.

## 2021-07-05 DIAGNOSIS — J1282 Pneumonia due to coronavirus disease 2019: Secondary | ICD-10-CM | POA: Diagnosis not present

## 2021-07-05 DIAGNOSIS — Z1152 Encounter for screening for COVID-19: Secondary | ICD-10-CM | POA: Diagnosis not present

## 2021-07-05 DIAGNOSIS — U071 COVID-19: Secondary | ICD-10-CM | POA: Diagnosis not present

## 2021-07-05 DIAGNOSIS — R0981 Nasal congestion: Secondary | ICD-10-CM | POA: Diagnosis not present

## 2021-07-05 DIAGNOSIS — R509 Fever, unspecified: Secondary | ICD-10-CM | POA: Diagnosis not present

## 2021-07-05 DIAGNOSIS — R0602 Shortness of breath: Secondary | ICD-10-CM | POA: Diagnosis not present

## 2021-07-05 DIAGNOSIS — J029 Acute pharyngitis, unspecified: Secondary | ICD-10-CM | POA: Diagnosis not present

## 2021-10-02 DIAGNOSIS — D224 Melanocytic nevi of scalp and neck: Secondary | ICD-10-CM | POA: Diagnosis not present

## 2021-10-02 DIAGNOSIS — D2239 Melanocytic nevi of other parts of face: Secondary | ICD-10-CM | POA: Diagnosis not present

## 2021-12-30 ENCOUNTER — Encounter (HOSPITAL_COMMUNITY): Payer: Self-pay

## 2021-12-30 ENCOUNTER — Emergency Department (HOSPITAL_COMMUNITY)
Admission: EM | Admit: 2021-12-30 | Discharge: 2021-12-30 | Disposition: A | Discharge status: Home / Self Care | Payer: BC Managed Care – PPO | Attending: Emergency Medicine | Admitting: Emergency Medicine

## 2021-12-30 ENCOUNTER — Other Ambulatory Visit: Payer: Self-pay

## 2021-12-30 DIAGNOSIS — R1033 Periumbilical pain: Secondary | ICD-10-CM

## 2021-12-30 DIAGNOSIS — R110 Nausea: Secondary | ICD-10-CM | POA: Insufficient documentation

## 2021-12-30 NOTE — ED Triage Notes (Signed)
Pt reports low abdominal to pelvic pain that began earlier today. Pt endorses intermittent N/V with pain. Pt denies abnormal vaginal discharge or bleeding. Denies diarrhea.

## 2021-12-30 NOTE — ED Provider Notes (Signed)
  [facility] DEPT Provider Note   CSN: [id number] Arrival date & time: 12/30/21  1348     History {Add pertinent medical, surgical, social history, OB history to HPI:1} Chief Complaint  Patient presents with  . Abdominal Pain    Jennifer Petersen is a 51 y.o. female.  Patient with no pertinent past medical history presents today with complaints of abdominal pain. She states that same began approximately 2 hours prior to my evaluation and was severe in the suprapubic region and RLQ. She states that same was significant for 1 hour and then resolved completely. She states that during this interval   The history is provided by the patient. No language interpreter was used.  Abdominal Pain      Home Medications Prior to Admission medications   Medication Sig Start Date End Date Taking? Authorizing Provider  oxyCODONE (OXY IR/ROXICODONE) 5 MG immediate release tablet Take 1 tablet (5 mg total) by mouth every 6 (six) hours as needed for moderate pain, severe pain or breakthrough pain. 11/05/17   Coralie Keens, MD      Allergies    Patient has no known allergies.    Review of Systems   Review of Systems  Gastrointestinal:  Positive for abdominal pain.    Physical Exam Updated Vital Signs BP 106/61 (BP Location: Left Arm)   Pulse 61   Temp 98.1 F (36.7 C) (Oral)   Resp 16   SpO2 100%  Physical Exam  ED Results / Procedures / Treatments   Labs (all labs ordered are listed, but only abnormal results are displayed) Labs Reviewed - No data to display  EKG None  Radiology No results found.  Procedures Procedures  {Document cardiac monitor, telemetry assessment procedure when appropriate:1}  Medications Ordered in ED Medications - No data to display  ED Course/ Medical Decision Making/ A&P                           Medical Decision Making  ***  {Document critical care time when appropriate:1} {Document review of labs and clinical  decision tools ie heart score, Chads2Vasc2 etc:1}  {Document your independent review of radiology images, and any outside records:1} {Document your discussion with family members, caretakers, and with consultants:1} {Document social determinants of health affecting pt's care:1} {Document your decision making why or why not admission, treatments were needed:1} Final Clinical Impression(s) / ED Diagnoses Final diagnoses:  None    Rx / DC Orders ED Discharge Orders     None

## 2021-12-30 NOTE — Discharge Instructions (Signed)
Follow-up with your pcp for continued evaluation and management of your symptoms.   Return if development of any new or worsening symptoms

## 2022-01-17 DIAGNOSIS — E785 Hyperlipidemia, unspecified: Secondary | ICD-10-CM | POA: Diagnosis not present

## 2022-01-17 DIAGNOSIS — Z0000 Encounter for general adult medical examination without abnormal findings: Secondary | ICD-10-CM | POA: Diagnosis not present

## 2022-01-17 DIAGNOSIS — R7989 Other specified abnormal findings of blood chemistry: Secondary | ICD-10-CM | POA: Diagnosis not present

## 2022-01-17 DIAGNOSIS — D509 Iron deficiency anemia, unspecified: Secondary | ICD-10-CM | POA: Diagnosis not present

## 2022-01-22 DIAGNOSIS — Z01419 Encounter for gynecological examination (general) (routine) without abnormal findings: Secondary | ICD-10-CM | POA: Diagnosis not present

## 2022-01-22 DIAGNOSIS — R69 Illness, unspecified: Secondary | ICD-10-CM | POA: Diagnosis not present

## 2022-01-22 DIAGNOSIS — Z1231 Encounter for screening mammogram for malignant neoplasm of breast: Secondary | ICD-10-CM | POA: Diagnosis not present

## 2022-01-22 DIAGNOSIS — Z6823 Body mass index (BMI) 23.0-23.9, adult: Secondary | ICD-10-CM | POA: Diagnosis not present

## 2022-01-22 DIAGNOSIS — Z124 Encounter for screening for malignant neoplasm of cervix: Secondary | ICD-10-CM | POA: Diagnosis not present

## 2022-01-22 DIAGNOSIS — Z01411 Encounter for gynecological examination (general) (routine) with abnormal findings: Secondary | ICD-10-CM | POA: Diagnosis not present

## 2022-01-22 DIAGNOSIS — Z0142 Encounter for cervical smear to confirm findings of recent normal smear following initial abnormal smear: Secondary | ICD-10-CM | POA: Diagnosis not present

## 2022-01-28 DIAGNOSIS — R82998 Other abnormal findings in urine: Secondary | ICD-10-CM | POA: Diagnosis not present

## 2022-03-02 DIAGNOSIS — Z23 Encounter for immunization: Secondary | ICD-10-CM | POA: Diagnosis not present

## 2023-01-27 DIAGNOSIS — R1030 Lower abdominal pain, unspecified: Secondary | ICD-10-CM | POA: Diagnosis not present

## 2023-01-27 DIAGNOSIS — R110 Nausea: Secondary | ICD-10-CM | POA: Diagnosis not present

## 2023-01-27 DIAGNOSIS — R1084 Generalized abdominal pain: Secondary | ICD-10-CM | POA: Diagnosis not present

## 2023-01-27 DIAGNOSIS — R509 Fever, unspecified: Secondary | ICD-10-CM | POA: Diagnosis not present

## 2023-01-27 DIAGNOSIS — R197 Diarrhea, unspecified: Secondary | ICD-10-CM | POA: Diagnosis not present

## 2023-01-28 ENCOUNTER — Other Ambulatory Visit: Payer: Self-pay

## 2023-01-28 ENCOUNTER — Emergency Department (HOSPITAL_COMMUNITY): Payer: 59

## 2023-01-28 ENCOUNTER — Encounter (HOSPITAL_COMMUNITY): Payer: Self-pay

## 2023-01-28 ENCOUNTER — Ambulatory Visit (HOSPITAL_COMMUNITY)
Admission: EM | Admit: 2023-01-28 | Discharge: 2023-01-28 | Disposition: A | Discharge status: Home / Self Care | Payer: 59 | Attending: Emergency Medicine | Admitting: Emergency Medicine

## 2023-01-28 ENCOUNTER — Encounter (HOSPITAL_COMMUNITY)
Admission: EM | Disposition: A | Discharge status: Home / Self Care | Payer: Self-pay | Source: Home / Self Care | Attending: Emergency Medicine

## 2023-01-28 ENCOUNTER — Emergency Department (HOSPITAL_COMMUNITY): Payer: 59 | Admitting: Anesthesiology

## 2023-01-28 ENCOUNTER — Emergency Department (EMERGENCY_DEPARTMENT_HOSPITAL): Payer: 59 | Admitting: Anesthesiology

## 2023-01-28 DIAGNOSIS — Z9049 Acquired absence of other specified parts of digestive tract: Secondary | ICD-10-CM | POA: Diagnosis not present

## 2023-01-28 DIAGNOSIS — K3580 Unspecified acute appendicitis: Secondary | ICD-10-CM

## 2023-01-28 DIAGNOSIS — K37 Unspecified appendicitis: Secondary | ICD-10-CM

## 2023-01-28 DIAGNOSIS — K3533 Acute appendicitis with perforation and localized peritonitis, with abscess: Secondary | ICD-10-CM | POA: Diagnosis not present

## 2023-01-28 DIAGNOSIS — K353 Acute appendicitis with localized peritonitis, without perforation or gangrene: Secondary | ICD-10-CM | POA: Diagnosis not present

## 2023-01-28 DIAGNOSIS — R109 Unspecified abdominal pain: Secondary | ICD-10-CM | POA: Diagnosis not present

## 2023-01-28 HISTORY — PX: LAPAROSCOPIC APPENDECTOMY: SHX408

## 2023-01-28 LAB — URINALYSIS, ROUTINE W REFLEX MICROSCOPIC
Bilirubin Urine: NEGATIVE
Glucose, UA: NEGATIVE mg/dL
Ketones, ur: 20 mg/dL — AB
Leukocytes,Ua: NEGATIVE
Nitrite: NEGATIVE
Protein, ur: 100 mg/dL — AB
Specific Gravity, Urine: 1.019 (ref 1.005–1.030)
pH: 5 (ref 5.0–8.0)

## 2023-01-28 LAB — CBC
HCT: 42.5 % (ref 36.0–46.0)
Hemoglobin: 14.2 g/dL (ref 12.0–15.0)
MCH: 30.4 pg (ref 26.0–34.0)
MCHC: 33.4 g/dL (ref 30.0–36.0)
MCV: 91 fL (ref 80.0–100.0)
Platelets: 273 10*3/uL (ref 150–400)
RBC: 4.67 MIL/uL (ref 3.87–5.11)
RDW: 12.7 % (ref 11.5–15.5)
WBC: 15 10*3/uL — ABNORMAL HIGH (ref 4.0–10.5)
nRBC: 0 % (ref 0.0–0.2)

## 2023-01-28 LAB — COMPREHENSIVE METABOLIC PANEL
ALT: 17 U/L (ref 0–44)
AST: 24 U/L (ref 15–41)
Albumin: 3.4 g/dL — ABNORMAL LOW (ref 3.5–5.0)
Alkaline Phosphatase: 43 U/L (ref 38–126)
Anion gap: 12 (ref 5–15)
BUN: 11 mg/dL (ref 6–20)
CO2: 22 mmol/L (ref 22–32)
Calcium: 9 mg/dL (ref 8.9–10.3)
Chloride: 100 mmol/L (ref 98–111)
Creatinine, Ser: 0.72 mg/dL (ref 0.44–1.00)
GFR, Estimated: >60 mL/min (ref >60)
Glucose, Bld: 121 mg/dL — ABNORMAL HIGH (ref 70–99)
Potassium: 3.7 mmol/L (ref 3.5–5.1)
Sodium: 134 mmol/L — ABNORMAL LOW (ref 135–145)
Total Bilirubin: 1.4 mg/dL — ABNORMAL HIGH (ref 0.3–1.2)
Total Protein: 6.8 g/dL (ref 6.5–8.1)

## 2023-01-28 LAB — HCG, SERUM, QUALITATIVE: Preg, Serum: NEGATIVE

## 2023-01-28 LAB — LIPASE, BLOOD: Lipase: 25 U/L (ref 11–51)

## 2023-01-28 SURGERY — APPENDECTOMY, LAPAROSCOPIC
Anesthesia: General | Site: Abdomen

## 2023-01-28 MED ORDER — MIDAZOLAM HCL 2 MG/2ML IJ SOLN
INTRAMUSCULAR | Status: AC
  Filled 2023-01-28: qty 2

## 2023-01-28 MED ORDER — CHLORHEXIDINE GLUCONATE 0.12 % MT SOLN
OROMUCOSAL | Status: AC
  Administered 2023-01-28: 15 mL via OROMUCOSAL
  Filled 2023-01-28: qty 15

## 2023-01-28 MED ORDER — EPHEDRINE SULFATE-NACL 50-0.9 MG/10ML-% IV SOSY
PREFILLED_SYRINGE | INTRAVENOUS | Status: DC | PRN
  Administered 2023-01-28: 10 mg via INTRAVENOUS

## 2023-01-28 MED ORDER — ROCURONIUM BROMIDE 10 MG/ML (PF) SYRINGE
PREFILLED_SYRINGE | INTRAVENOUS | Status: DC | PRN
  Administered 2023-01-28: 40 mg via INTRAVENOUS

## 2023-01-28 MED ORDER — OXYCODONE HCL 5 MG/5ML PO SOLN: 5.0000 mg | Freq: Once | ORAL | Status: DC | PRN

## 2023-01-28 MED ORDER — FENTANYL CITRATE (PF) 100 MCG/2ML IJ SOLN
INTRAMUSCULAR | Status: AC
  Filled 2023-01-28: qty 2

## 2023-01-28 MED ORDER — OXYCODONE HCL 5 MG PO TABS: 5.0000 mg | ORAL_TABLET | Freq: Once | ORAL | Status: DC | PRN

## 2023-01-28 MED ORDER — BUPIVACAINE-EPINEPHRINE 0.25% -1:200000 IJ SOLN
INTRAMUSCULAR | Status: DC | PRN
  Administered 2023-01-28: 30 mL

## 2023-01-28 MED ORDER — FENTANYL CITRATE PF 50 MCG/ML IJ SOSY: 50.0000 ug | PREFILLED_SYRINGE | Freq: Once | INTRAMUSCULAR | Status: DC

## 2023-01-28 MED ORDER — ROCURONIUM BROMIDE 10 MG/ML (PF) SYRINGE
PREFILLED_SYRINGE | INTRAVENOUS | Status: AC
  Filled 2023-01-28: qty 10

## 2023-01-28 MED ORDER — LACTATED RINGERS IV SOLN: INTRAVENOUS | Status: DC

## 2023-01-28 MED ORDER — OXYCODONE HCL 5 MG PO TABS
ORAL_TABLET | ORAL | Status: AC
  Filled 2023-01-28: qty 1

## 2023-01-28 MED ORDER — PIPERACILLIN-TAZOBACTAM 3.375 G IVPB 30 MIN
3.3750 g | Freq: Once | INTRAVENOUS | Status: AC
  Administered 2023-01-28: 3.375 g via INTRAVENOUS
  Filled 2023-01-28: qty 50

## 2023-01-28 MED ORDER — METOCLOPRAMIDE HCL 5 MG/ML IJ SOLN
10.0000 mg | INTRAMUSCULAR | Status: AC
  Administered 2023-01-28: 10 mg via INTRAVENOUS
  Filled 2023-01-28: qty 2

## 2023-01-28 MED ORDER — PROPOFOL 10 MG/ML IV BOLUS
INTRAVENOUS | Status: DC | PRN
  Administered 2023-01-28: 150 mg via INTRAVENOUS

## 2023-01-28 MED ORDER — BUPIVACAINE-EPINEPHRINE (PF) 0.25% -1:200000 IJ SOLN
INTRAMUSCULAR | Status: AC
  Filled 2023-01-28: qty 30

## 2023-01-28 MED ORDER — ONDANSETRON HCL 4 MG/2ML IJ SOLN
INTRAMUSCULAR | Status: DC | PRN
  Administered 2023-01-28: 4 mg via INTRAVENOUS

## 2023-01-28 MED ORDER — SUGAMMADEX SODIUM 200 MG/2ML IV SOLN
INTRAVENOUS | Status: DC | PRN
  Administered 2023-01-28: 200 mg via INTRAVENOUS

## 2023-01-28 MED ORDER — LIDOCAINE 2% (20 MG/ML) 5 ML SYRINGE
INTRAMUSCULAR | Status: AC
  Filled 2023-01-28: qty 5

## 2023-01-28 MED ORDER — ORAL CARE MOUTH RINSE: 15.0000 mL | Freq: Once | OROMUCOSAL | Status: AC

## 2023-01-28 MED ORDER — PROPOFOL 10 MG/ML IV BOLUS
INTRAVENOUS | Status: AC
  Filled 2023-01-28: qty 20

## 2023-01-28 MED ORDER — PIPERACILLIN-TAZOBACTAM 3.375 G IVPB
3.3750 g | Freq: Once | INTRAVENOUS | Status: AC
  Administered 2023-01-28: 3.375 g via INTRAVENOUS
  Filled 2023-01-28: qty 50

## 2023-01-28 MED ORDER — ACETAMINOPHEN 10 MG/ML IV SOLN
1000.0000 mg | Freq: Once | INTRAVENOUS | Status: DC | PRN
  Administered 2023-01-28: 1000 mg via INTRAVENOUS

## 2023-01-28 MED ORDER — EPHEDRINE 5 MG/ML INJ
INTRAVENOUS | Status: AC
  Filled 2023-01-28: qty 5

## 2023-01-28 MED ORDER — ONDANSETRON HCL 4 MG/2ML IJ SOLN
INTRAMUSCULAR | Status: AC
  Filled 2023-01-28: qty 2

## 2023-01-28 MED ORDER — PANTOPRAZOLE SODIUM 40 MG IV SOLR
40.0000 mg | Freq: Once | INTRAVENOUS | Status: AC
  Administered 2023-01-28: 40 mg via INTRAVENOUS
  Filled 2023-01-28: qty 10

## 2023-01-28 MED ORDER — ACETAMINOPHEN 10 MG/ML IV SOLN
INTRAVENOUS | Status: AC
  Filled 2023-01-28: qty 100

## 2023-01-28 MED ORDER — FENTANYL CITRATE (PF) 100 MCG/2ML IJ SOLN
25.0000 ug | INTRAMUSCULAR | Status: DC | PRN
  Administered 2023-01-28: 50 ug via INTRAVENOUS

## 2023-01-28 MED ORDER — 0.9 % SODIUM CHLORIDE (POUR BTL) OPTIME
TOPICAL | Status: DC | PRN
  Administered 2023-01-28: 1000 mL

## 2023-01-28 MED ORDER — SODIUM CHLORIDE 0.9 % IR SOLN
Status: DC | PRN
  Administered 2023-01-28: 1000 mL

## 2023-01-28 MED ORDER — MIDAZOLAM HCL 2 MG/2ML IJ SOLN
INTRAMUSCULAR | Status: DC | PRN
  Administered 2023-01-28: 2 mg via INTRAVENOUS

## 2023-01-28 MED ORDER — FENTANYL CITRATE (PF) 250 MCG/5ML IJ SOLN
INTRAMUSCULAR | Status: AC
  Filled 2023-01-28: qty 5

## 2023-01-28 MED ORDER — OXYCODONE-ACETAMINOPHEN 5-325 MG PO TABS
1.0000 | ORAL_TABLET | ORAL | 0 refills | Status: AC | PRN
Start: 2023-01-28 — End: 2024-01-28

## 2023-01-28 MED ORDER — AMOXICILLIN-POT CLAVULANATE 875-125 MG PO TABS: 1.0000 | ORAL_TABLET | Freq: Two times a day (BID) | ORAL | 0 refills | Status: AC

## 2023-01-28 MED ORDER — FENTANYL CITRATE PF 50 MCG/ML IJ SOSY: 50.0000 ug | PREFILLED_SYRINGE | INTRAMUSCULAR | Status: DC | PRN

## 2023-01-28 MED ORDER — ONDANSETRON HCL 4 MG/2ML IJ SOLN: 4.0000 mg | Freq: Once | INTRAMUSCULAR | Status: DC | PRN

## 2023-01-28 MED ORDER — DEXAMETHASONE SODIUM PHOSPHATE 10 MG/ML IJ SOLN
INTRAMUSCULAR | Status: DC | PRN
  Administered 2023-01-28: 10 mg via INTRAVENOUS

## 2023-01-28 MED ORDER — FENTANYL CITRATE (PF) 250 MCG/5ML IJ SOLN
INTRAMUSCULAR | Status: DC | PRN
  Administered 2023-01-28: 50 ug via INTRAVENOUS
  Administered 2023-01-28: 100 ug via INTRAVENOUS

## 2023-01-28 MED ORDER — FENTANYL CITRATE PF 50 MCG/ML IJ SOSY
50.0000 ug | PREFILLED_SYRINGE | Freq: Once | INTRAMUSCULAR | Status: AC
  Administered 2023-01-28: 50 ug via INTRAVENOUS
  Filled 2023-01-28: qty 1

## 2023-01-28 MED ORDER — DEXAMETHASONE SODIUM PHOSPHATE 10 MG/ML IJ SOLN
INTRAMUSCULAR | Status: AC
  Filled 2023-01-28: qty 1

## 2023-01-28 MED ORDER — IOHEXOL 350 MG/ML SOLN
75.0000 mL | Freq: Once | INTRAVENOUS | Status: AC | PRN
  Administered 2023-01-28: 75 mL via INTRAVENOUS

## 2023-01-28 MED ORDER — LIDOCAINE 2% (20 MG/ML) 5 ML SYRINGE
INTRAMUSCULAR | Status: DC | PRN
  Administered 2023-01-28: 100 mg via INTRAVENOUS

## 2023-01-28 MED ORDER — CHLORHEXIDINE GLUCONATE 0.12 % MT SOLN: 15.0000 mL | Freq: Once | OROMUCOSAL | Status: AC

## 2023-01-28 MED ORDER — SUCCINYLCHOLINE CHLORIDE 200 MG/10ML IV SOSY
PREFILLED_SYRINGE | INTRAVENOUS | Status: DC | PRN
  Administered 2023-01-28: 100 mg via INTRAVENOUS

## 2023-01-28 SURGICAL SUPPLY — 51 items
ADH SKN CLS APL DERMABOND .7 (GAUZE/BANDAGES/DRESSINGS) ×1
APL PRP STRL LF DISP 70% ISPRP (MISCELLANEOUS) ×1
APPLIER CLIP 5 13 M/L LIGAMAX5 (MISCELLANEOUS)
APR CLP MED LRG 5 ANG JAW (MISCELLANEOUS)
BAG COUNTER SPONGE SURGICOUNT (BAG) ×2 IMPLANT
BAG SPNG CNTER NS LX DISP (BAG) ×1
BLADE CLIPPER SURG (BLADE) IMPLANT
CANISTER SUCT 3000ML PPV (MISCELLANEOUS) ×2 IMPLANT
CHLORAPREP W/TINT 26 (MISCELLANEOUS) ×2 IMPLANT
CLIP APPLIE 5 13 M/L LIGAMAX5 (MISCELLANEOUS) IMPLANT
COVER SURGICAL LIGHT HANDLE (MISCELLANEOUS) ×2 IMPLANT
DERMABOND ADVANCED .7 DNX12 (GAUZE/BANDAGES/DRESSINGS) ×2 IMPLANT
ELECT REM PT RETURN 9FT ADLT (ELECTROSURGICAL) ×1
ELECTRODE REM PT RTRN 9FT ADLT (ELECTROSURGICAL) ×2 IMPLANT
GLOVE BIO SURGEON STRL SZ7.5 (GLOVE) ×2 IMPLANT
GLOVE BIOGEL PI IND STRL 8 (GLOVE) ×2 IMPLANT
GOWN STRL REUS W/ TWL LRG LVL3 (GOWN DISPOSABLE) ×4 IMPLANT
GOWN STRL REUS W/ TWL XL LVL3 (GOWN DISPOSABLE) ×2 IMPLANT
GOWN STRL REUS W/TWL LRG LVL3 (GOWN DISPOSABLE) ×2
GOWN STRL REUS W/TWL XL LVL3 (GOWN DISPOSABLE) ×1
GRASPER SUT TROCAR 14GX15 (MISCELLANEOUS) ×2 IMPLANT
IRRIG SUCT STRYKERFLOW 2 WTIP (MISCELLANEOUS) ×1
IRRIGATION SUCT STRKRFLW 2 WTP (MISCELLANEOUS) ×2 IMPLANT
KIT BASIN OR (CUSTOM PROCEDURE TRAY) ×2 IMPLANT
KIT TURNOVER KIT B (KITS) ×2 IMPLANT
NDL 22X1.5 STRL (OR ONLY) (MISCELLANEOUS) ×2 IMPLANT
NDL INSUFFLATION 14GA 120MM (NEEDLE) ×2 IMPLANT
NEEDLE 22X1.5 STRL (OR ONLY) (MISCELLANEOUS) ×1 IMPLANT
NEEDLE INSUFFLATION 14GA 120MM (NEEDLE) ×1 IMPLANT
NS IRRIG 1000ML POUR BTL (IV SOLUTION) ×2 IMPLANT
PAD ARMBOARD 7.5X6 YLW CONV (MISCELLANEOUS) ×4 IMPLANT
RELOAD STAPLE 60 2.6 WHT THN (STAPLE) ×2 IMPLANT
RELOAD STAPLER WHITE 60MM (STAPLE) ×1 IMPLANT
SCISSORS LAP 5X35 DISP (ENDOMECHANICALS) IMPLANT
SET TUBE SMOKE EVAC HIGH FLOW (TUBING) ×2 IMPLANT
SHEARS HARMONIC ACE PLUS 36CM (ENDOMECHANICALS) ×2 IMPLANT
SLEEVE Z-THREAD 5X100MM (TROCAR) ×2 IMPLANT
SPECIMEN JAR SMALL (MISCELLANEOUS) ×2 IMPLANT
STAPLER POWER ECHELON 60 WIDE (STAPLE) IMPLANT
STAPLER RELOAD WHITE 60MM (STAPLE) ×1
SUT MNCRL AB 4-0 PS2 18 (SUTURE) ×2 IMPLANT
SYS BAG RETRIEVAL 10MM (BASKET) ×1
SYSTEM BAG RETRIEVAL 10MM (BASKET) ×2 IMPLANT
TOWEL GREEN STERILE FF (TOWEL DISPOSABLE) ×2 IMPLANT
TRAY FOLEY W/BAG SLVR 16FR (SET/KITS/TRAYS/PACK)
TRAY FOLEY W/BAG SLVR 16FR ST (SET/KITS/TRAYS/PACK) IMPLANT
TRAY LAPAROSCOPIC MC (CUSTOM PROCEDURE TRAY) ×2 IMPLANT
TROCAR Z THREAD OPTICAL 12X100 (TROCAR) ×2 IMPLANT
TROCAR Z-THREAD OPTICAL 5X100M (TROCAR) ×2 IMPLANT
WARMER LAPAROSCOPE (MISCELLANEOUS) ×2 IMPLANT
WATER STERILE IRR 1000ML POUR (IV SOLUTION) ×2 IMPLANT

## 2023-01-28 NOTE — ED Notes (Signed)
Patient is resting comfortably. 

## 2023-01-28 NOTE — ED Triage Notes (Signed)
Pt is coming from home with medic for RLQ pain that stared 24hrs ago that has been progressing in pain but at 9pm lastnight she had significantly worsening pain with nausea and diarrhea. Medic reports a low grade fever as well. Does have her appendix but within the last 5 years she ahs had her gallbladder removed.   Medic vitals   102/70 72hr 113bgl 98% 18rr 99.2 oral at home

## 2023-01-28 NOTE — ED Provider Triage Note (Signed)
Emergency Medicine Provider Triage Evaluation Note  Jennifer Petersen , a 52 y.o. female  was evaluated in triage.  Pt complains of R mid and upper abdominal pain. Pain began yesterday (Sunday) after eating a salad, but patient states that this sometimes causes an upset stomach. Woke up and had persistent pain, diarrhea. Pain began to worsen in the afternoon around 1330. Developed associated N/V. Pain aggravated by movement, twisting, walking. No fevers, urinary symptoms. Last colonoscopy ~4 years ago; hx of benign polyps. SHx significant for cholecystectomy. Received 4mg  Zofran by EMS en route to ED.  Review of Systems  Positive: As above Negative: As above  Physical Exam  BP 111/78 (BP Location: Left Arm)   Pulse 73   Temp 100 F (37.8 C)   Resp 19   Ht 5\' 9"  (1.753 m)   Wt 68 kg   SpO2 100%   BMI 22.15 kg/m  Gen:   Awake, no distress   Resp:  Normal effort  MSK:   Moves extremities without difficulty  Other:  Focal RUQ and R mid abdominal TTP. Referred pain to R abdomen with palpation in the epigastrium, LLQ. No palpable masses or peritoneal signs.  Medical Decision Making  Medically screening exam initiated at 12:34 AM.  Appropriate orders placed.  Jennifer Petersen was informed that the remainder of the evaluation will be completed by another provider, this initial triage assessment does not replace that evaluation, and the importance of remaining in the ED until their evaluation is complete.  As above - work up initiated. Low grade temp, but no SIRS criteria to suggest sepsis. Pending abdominal CT.   Antony Madura, PA-C 01/28/23 [phone]

## 2023-01-28 NOTE — ED Notes (Signed)
Pt well appearing upon transport.

## 2023-01-28 NOTE — Op Note (Signed)
Patient: Jennifer Petersen ([birth year], [id number])  Date of Surgery: 01/28/23  Preoperative Diagnosis: appendicitis   Postoperative Diagnosis: appendicitis   Surgical Procedure: APPENDECTOMY LAPAROSCOPIC:    Operative Team Members:  Surgeons and Role:    * Gwenlyn Hottinger, Hyman Hopes, MD - Primary   Anesthesiologist: Eilene Ghazi, MD CRNA: Margarita Rana, CRNA; Rogge, Canary Brim, CRNA   Anesthesia: General   Fluids:  No intake/output data recorded.  Complications: * No complications entered in OR log *  Drains:  none   Specimen:  ID Type Source Tests Collected by Time Destination  1 : appendix Tissue PATH Appendix SURGICAL PATHOLOGY Samarra Ridgely, Hyman Hopes, MD 01/28/2023 1137      Disposition:  PACU - hemodynamically stable.  Plan of Care: Discharge to home after PACU    Indications for Procedure: Jennifer Petersen is a 52 y.o. female who presented with abdominal pain.  History, physical and imaging was concerning for appendicitis, so laparoscopic appendectomy was recommended for the patient.  The procedure itself, as well as the risks, benefits and alternatives were discussed with the patient.  Risks discussed included but were not limited to the risk of bleeding, infection, damage to nearby structures, need to convert to open procedure, incisional hernia, and the need for additional procedures or surgeries.  With this discussion complete and all questions answered the patient granted consent to proceed.  Findings: inflamed appendix  Infection status: Patient: Jennifer Petersen [facility] Service Patient Case: Emergent Infection Present At Time Of Surgery (PATOS): Infection of the appendix   Description of Procedure:   On the date stated above, the patient was taken to the operating room suite and placed in supine positioning with the left arm tucked.  Sequential compression devices were placed on the lower extremities to prevent blood clots.  General endotracheal  anesthesia was induced.  The patient urinated just prior to surgery so a foley catheter was not placed.  Preoperative antibiotics were given.  The patient's abdomen was prepped and draped in the usual sterile fashion.  A time-out was completed verifying the correct patient, procedure, positioning and equipment needed for the case.  We began by anesthetizing the skin with local anesthetic and then making a 5 mm incision just below the umbilicus.  We dissected through the subcutaneous tissues to the fascia.  The fascia was grasped and elevated using a Kocher clamp.  A Veress needle was inserted into the abdomen and the abdomen was insufflated to 15 mmHg.  A 5 mm trocar was inserted in this position under optical guidance and then the abdomen was inspected.  There was no trauma to the underlying viscera with initial trocar placement.  Any abnormal findings, other than inflammation in the right lower quadrant, are listed above in the findings section.  Two additional trocars were placed, one 5 mm trocar suprapubically and one 12 mm trocar in the left lower quadrant.  These were placed under direct vision without any trauma to the underlying viscera.    The patient was then placed in head down, left side down positioning.  The appendix was identified and dissected free from its attachments to the abdominal wall, small intestine and cecum.  A window was created in the mesoappendix using blunt dissection.  We used one 60 mm white load of the endoscopic linear stapler to divide the base of the appendix from the cecum.  Then the harmonic scalpel was used to divide the mesoappendix.  The appendix was placed in an endocatch bag and removed  through the 12 mm port site in the left lower quadrant.  A suction irrigator was used to clean the operative field. The staple line was well formed.  There was good hemostasis at the end of the case.  At this point we directed our attention to closure.  The patient was moved back to a  level position.  The 12 mm trocar site was closed at the fascial level using an 0-vicryl on a fascial suture passer.  The abdomen was desufflated.  The skin was closed using 4-0 Monocryl and dermabond.  All sponge and needle counts were correct at the end of the case.    Ivar Drape, MD General, Bariatric, & Minimally Invasive Surgery [facility], [place]

## 2023-01-28 NOTE — ED Provider Notes (Signed)
[place] EMERGENCY DEPARTMENT AT [facility] Provider Note   CSN: [id number] Arrival date & time: 01/28/23  0018     History  Chief Complaint  Patient presents with   Abdominal Pain    Jennifer Petersen is a 52 y.o. female.   Abdominal Pain Associated symptoms: diarrhea, nausea and vomiting      52 year old female presenting to the emergency department with abdominal pain.  The patient states that she developed right lower quadrant abdominal pain 24 hours prior to arrival that has been progressing.  Worsened 9 PM last night with worsening nausea.  She had had having some diarrhea with low-grade fevers as well.  Has had some NBNB emesis.  She denies any history of appendicitis and still has an appendix.  She denies any history of diverticulitis.  She is status post cholecystectomy.  She received 4 mg of Zofran by EMS and route to the emergency department.  She continues to endorse severe and sharp right lower quadrant abdominal pain.  Home Medications Prior to Admission medications   Medication Sig Start Date End Date Taking? Authorizing Provider  oxyCODONE (OXY IR/ROXICODONE) 5 MG immediate release tablet Take 1 tablet (5 mg total) by mouth every 6 (six) hours as needed for moderate pain, severe pain or breakthrough pain. 11/05/17   Abigail Miyamoto, MD      Allergies    Patient has no known allergies.    Review of Systems   Review of Systems  Gastrointestinal:  Positive for abdominal pain, diarrhea, nausea and vomiting.  All other systems reviewed and are negative.   Physical Exam Updated Vital Signs BP (!) 100/50   Pulse 70   Temp 98.3 F (36.8 C) (Oral)   Resp 16   Ht 5\' 9"  (1.753 m)   Wt 68 kg   SpO2 100%   BMI 22.15 kg/m  Physical Exam Vitals and nursing note reviewed.  Constitutional:      General: She is not in acute distress.    Appearance: She is well-developed.  HENT:     Head: Normocephalic and atraumatic.  Eyes:     Conjunctiva/sclera:  Conjunctivae normal.  Cardiovascular:     Rate and Rhythm: Normal rate and regular rhythm.     Heart sounds: No murmur heard. Pulmonary:     Effort: Pulmonary effort is normal. No respiratory distress.     Breath sounds: Normal breath sounds.  Abdominal:     Palpations: Abdomen is soft.     Tenderness: There is abdominal tenderness. There is guarding. Positive signs include Rovsing's sign and McBurney's sign.  Musculoskeletal:        General: No swelling.     Cervical back: Neck supple.  Skin:    General: Skin is warm and dry.     Capillary Refill: Capillary refill takes less than 2 seconds.  Neurological:     Mental Status: She is alert.  Psychiatric:        Mood and Affect: Mood normal.     ED Results / Procedures / Treatments   Labs (all labs ordered are listed, but only abnormal results are displayed) Labs Reviewed  COMPREHENSIVE METABOLIC PANEL - Abnormal; Notable for the following components:      Result Value   Sodium 134 (*)    Glucose, Bld 121 (*)    Albumin 3.4 (*)    Total Bilirubin 1.4 (*)    All other components within normal limits  CBC - Abnormal; Notable for the following components:  WBC 15.0 (*)    All other components within normal limits  URINALYSIS, ROUTINE W REFLEX MICROSCOPIC - Abnormal; Notable for the following components:   APPearance HAZY (*)    Hgb urine dipstick MODERATE (*)    Ketones, ur 20 (*)    Protein, ur 100 (*)    Bacteria, UA RARE (*)    All other components within normal limits  LIPASE, BLOOD  HCG, SERUM, QUALITATIVE    EKG None  Radiology CT ABDOMEN PELVIS W CONTRAST  Result Date: 01/28/2023 CLINICAL DATA:  Abdominal pain. EXAM: CT ABDOMEN AND PELVIS WITH CONTRAST TECHNIQUE: Multidetector CT imaging of the abdomen and pelvis was performed using the standard protocol following bolus administration of intravenous contrast. RADIATION DOSE REDUCTION: This exam was performed according to the departmental dose-optimization  program which includes automated exposure control, adjustment of the mA and/or kV according to patient size and/or use of iterative reconstruction technique. CONTRAST:  75mL OMNIPAQUE IOHEXOL 350 MG/ML SOLN COMPARISON:  None Available. FINDINGS: Lower chest: No acute abnormality. Hepatobiliary: No focal liver abnormality is seen. Status post cholecystectomy. No biliary dilatation. Pancreas: Unremarkable. No pancreatic ductal dilatation or surrounding inflammatory changes. Spleen: Normal in size without focal abnormality. Adrenals/Urinary Tract: Adrenal glands are unremarkable. Kidneys are normal, without renal calculi or focal lesions. Mild to moderate severity prominence of the intrarenal calices is noted throughout the left kidney. Bladder is unremarkable. Stomach/Bowel: Stomach is within normal limits. The appendix is dilated and moderately inflamed, with associated periappendiceal inflammatory fat stranding. No associated perforation or abscess is identified. No evidence of bowel dilatation. Vascular/Lymphatic: No significant vascular findings are present. No enlarged abdominal or pelvic lymph nodes. Reproductive: Small, ill-defined uterine fibroids are suspected. The bilateral adnexa are unremarkable. Other: No abdominal wall hernia or abnormality. No abdominopelvic ascites. Musculoskeletal: No acute or significant osseous findings. IMPRESSION: 1. Acute appendicitis. 2. Evidence of prior cholecystectomy. Electronically Signed   By: Aram Candela M.D.   On: 01/28/2023 04:15    Procedures Procedures    Medications Ordered in ED Medications  fentaNYL (SUBLIMAZE) injection 50 mcg (has no administration in time range)  metoCLOPramide (REGLAN) injection 10 mg (10 mg Intravenous Given 01/28/23 0046)  fentaNYL (SUBLIMAZE) injection 50 mcg (50 mcg Intravenous Given 01/28/23 0046)  pantoprazole (PROTONIX) injection 40 mg (40 mg Intravenous Given 01/28/23 0045)  iohexol (OMNIPAQUE) 350 MG/ML injection 75 mL  (75 mLs Intravenous Contrast Given 01/28/23 0229)  piperacillin-tazobactam (ZOSYN) IVPB 3.375 g (0 g Intravenous Stopped 01/28/23 0420)    ED Course/ Medical Decision Making/ A&P                                 Medical Decision Making Amount and/or Complexity of Data Reviewed Labs: ordered.  Risk Prescription drug management. Decision regarding hospitalization.    52 year old female presenting to the emergency department with abdominal pain.  The patient states that she developed right lower quadrant abdominal pain 24 hours prior to arrival that has been progressing.  Worsened 9 PM last night with worsening nausea.  She had had having some diarrhea with low-grade fevers as well.  Has had some NBNB emesis.  She denies any history of appendicitis and still has an appendix.  She denies any history of diverticulitis.  She is status post cholecystectomy.  She received 4 mg of Zofran by EMS and route to the emergency department.  She continues to endorse severe and sharp right lower quadrant abdominal pain.  On arrival, the patient was afebrile, mildly elevated temperature of 100, not tachycardic or tachypneic, BP 111/78, saturating 100% on room air.  Physical exam significant for positive McBurney's point tenderness, positive Rovsing sign, mild guarding present.  Concern for appendicitis.  Additionally considered gastroenteritis, consider diverticulitis.  Patient not meeting SIRS criteria, IV access was obtained and the patient was administered an IV fluid bolus, IV Reglan for nausea control, IV fentanyl for pain control.  Labs: Leukocytosis noted to 15, lipase normal, hCG negative, CMP generally unremarkable, urinalysis with hemoglobin present, negative nitrites and leukocytes, negative and unconvincing for UTI.  CT of the abdomen pelvis was performed: IMPRESSION:  1. Acute appendicitis.  2. Evidence of prior cholecystectomy.    Discussed the care of the patient with Dr. Janee Morn of general  surgery who will see the patient in consultation.  IV Zosyn in addition to further opiate pain control was ordered.  The patient was informed of the diagnosis.  Per Dr. Janee Morn, plan will be likely OR and potentially discharge after.  The patient was informed of the plan of care.   Final Clinical Impression(s) / ED Diagnoses Final diagnoses:  Acute appendicitis, unspecified acute appendicitis type    Rx / DC Orders ED Discharge Orders     None         Ernie Avena, MD 01/28/23 [phone]

## 2023-01-28 NOTE — Progress Notes (Signed)
Discussed case with Dr. Janee Morn.  Acute appendicitis.  Recommend laparoscopic appendectomy.  Discussed the procedure, its risks, benefits and alternatives.  After a full discussion and all questions answered the patient granted consent to proceed.  Will proceed as scheduled.

## 2023-01-28 NOTE — ED Notes (Signed)
Pt was called @6 :54am x3 No answer

## 2023-01-28 NOTE — Discharge Instructions (Addendum)

## 2023-01-28 NOTE — Progress Notes (Signed)
ED Pharmacy Antibiotic Sign Off An antibiotic consult was received from an ED provider for zosyn per pharmacy dosing for intra-abdominal. A chart review was completed to assess appropriateness.   The following one time order(s) were placed:  Zosyn 3.375g x 1 over 30 minutes  Further antibiotic and/or antibiotic pharmacy consults should be ordered by the admitting provider if indicated.   Thank you for allowing pharmacy to be a part of this patient's care.   Marja Kays, [facility]  Clinical Pharmacist 01/28/23 3:39 AM

## 2023-01-28 NOTE — H&P (Signed)
Jennifer Petersen is an 52 y.o. female.   Chief Complaint: RLQ pain HPI: 52yo F known to our practice S/P laparoscopic cholecystectomy by Dr. Magnus Ivan in 2019.  She went out to eat with her daughter on Sunday and afterwards had nausea and vomiting.  She developed abdominal pain.  The pain continued on Monday and gradually localized to her right lower quadrant.  She came to the emergency department and CT scan shows acute appendicitis.  White blood cell count is 15,000.  I was asked to see her for surgical management.  Past Medical History:  Diagnosis Date   Gallstones    Heart palpitations    History of anal fissures    History of hematuria    History of hyperkalemia    Lipoma 2011   Pleural nodules    tiny   Scoliosis    Tinnitus, left     Past Surgical History:  Procedure Laterality Date   ANAL FISSURE REPAIR     BREAST SURGERY  1997   Implants   CHOLECYSTECTOMY N/A 11/05/2017   Procedure: LAPAROSCOPIC CHOLECYSTECTOMY;  Surgeon: Abigail Miyamoto, MD;  Location: [facility] ORS;  Service: General;  Laterality: N/A;   COLONOSCOPY  07/12/2016   DILATION AND CURETTAGE OF UTERUS     LIPOMA EXCISION  04/2012    Family History  Problem Relation Age of Onset   Heart attack Maternal Grandmother    Diabetes Maternal Grandmother    Heart attack Paternal Grandfather    Social History:  reports that she has never smoked. She has never used smokeless tobacco. She reports that she does not drink alcohol and does not use drugs.  Allergies: No Known Allergies  (Not in a hospital admission)   Results for orders placed or performed during the hospital encounter of 01/28/23 (from the past 48 hour(s))  Lipase, blood     Status: None   Collection Time: 01/28/23 12:21 AM  Result Value Ref Range   Lipase 25 11 - 51 U/L    Comment: Performed at [facility], [address], [place], [place] [address]  Comprehensive metabolic panel     Status: Abnormal   Collection Time: 01/28/23 12:21 AM  Result  Value Ref Range   Sodium 134 (L) 135 - 145 mmol/L   Potassium 3.7 3.5 - 5.1 mmol/L   Chloride 100 98 - 111 mmol/L   CO2 22 22 - 32 mmol/L   Glucose, Bld 121 (H) 70 - 99 mg/dL    Comment: Glucose reference range applies only to samples taken after fasting for at least 8 hours.   BUN 11 6 - 20 mg/dL   Creatinine, Ser 0.10 0.44 - 1.00 mg/dL   Calcium 9.0 8.9 - 27.2 mg/dL   Total Protein 6.8 6.5 - 8.1 g/dL   Albumin 3.4 (L) 3.5 - 5.0 g/dL   AST 24 15 - 41 U/L   ALT 17 0 - 44 U/L   Alkaline Phosphatase 43 38 - 126 U/L   Total Bilirubin 1.4 (H) 0.3 - 1.2 mg/dL   GFR, Estimated >53 >66 mL/min    Comment: (NOTE) Calculated using the CKD-EPI Creatinine Equation (2021)    Anion gap 12 5 - 15    Comment: Performed at [facility], [address] [place], [place] [address]  CBC     Status: Abnormal   Collection Time: 01/28/23 12:21 AM  Result Value Ref Range   WBC 15.0 (H) 4.0 - 10.5 K/uL   RBC 4.67 3.87 -  5.11 MIL/uL   Hemoglobin 14.2 12.0 - 15.0 g/dL   HCT 10.2 72.5 - 36.6 %   MCV 91.0 80.0 - 100.0 fL   MCH 30.4 26.0 - 34.0 pg   MCHC 33.4 30.0 - 36.0 g/dL   RDW 44.0 34.7 - 42.5 %   Platelets 273 150 - 400 K/uL   nRBC 0.0 0.0 - 0.2 %    Comment: Performed at [facility], [address] [place], [place] [address]  hCG, serum, qualitative     Status: None   Collection Time: 01/28/23 12:21 AM  Result Value Ref Range   Preg, Serum NEGATIVE NEGATIVE    Comment:        THE SENSITIVITY OF THIS METHODOLOGY IS >10 mIU/mL. Performed at [facility], [address] [place], [place] [address]   Urinalysis, Routine w reflex microscopic -Urine, Clean Catch     Status: Abnormal   Collection Time: 01/28/23 12:54 AM  Result Value Ref Range   Color, Urine YELLOW YELLOW   APPearance HAZY (A) CLEAR   Specific Gravity, Urine 1.019 1.005 - 1.030   pH 5.0 5.0 - 8.0   Glucose, UA NEGATIVE NEGATIVE mg/dL   Hgb urine dipstick MODERATE (A) NEGATIVE   Bilirubin Urine NEGATIVE  NEGATIVE   Ketones, ur 20 (A) NEGATIVE mg/dL   Protein, ur 329 (A) NEGATIVE mg/dL   Nitrite NEGATIVE NEGATIVE   Leukocytes,Ua NEGATIVE NEGATIVE   RBC / HPF 6-10 0 - 5 RBC/hpf   WBC, UA 6-10 0 - 5 WBC/hpf   Bacteria, UA RARE (A) NONE SEEN   Squamous Epithelial / HPF 6-10 0 - 5 /HPF   Mucus PRESENT     Comment: Performed at [facility], [address], [place], [place] [address]   CT ABDOMEN PELVIS W CONTRAST  Result Date: 01/28/2023 CLINICAL DATA:  Abdominal pain. EXAM: CT ABDOMEN AND PELVIS WITH CONTRAST TECHNIQUE: Multidetector CT imaging of the abdomen and pelvis was performed using the standard protocol following bolus administration of intravenous contrast. RADIATION DOSE REDUCTION: This exam was performed according to the departmental dose-optimization program which includes automated exposure control, adjustment of the mA and/or kV according to patient size and/or use of iterative reconstruction technique. CONTRAST:  75mL OMNIPAQUE IOHEXOL 350 MG/ML SOLN COMPARISON:  None Available. FINDINGS: Lower chest: No acute abnormality. Hepatobiliary: No focal liver abnormality is seen. Status post cholecystectomy. No biliary dilatation. Pancreas: Unremarkable. No pancreatic ductal dilatation or surrounding inflammatory changes. Spleen: Normal in size without focal abnormality. Adrenals/Urinary Tract: Adrenal glands are unremarkable. Kidneys are normal, without renal calculi or focal lesions. Mild to moderate severity prominence of the intrarenal calices is noted throughout the left kidney. Bladder is unremarkable. Stomach/Bowel: Stomach is within normal limits. The appendix is dilated and moderately inflamed, with associated periappendiceal inflammatory fat stranding. No associated perforation or abscess is identified. No evidence of bowel dilatation. Vascular/Lymphatic: No significant vascular findings are present. No enlarged abdominal or pelvic lymph nodes. Reproductive: Small, ill-defined  uterine fibroids are suspected. The bilateral adnexa are unremarkable. Other: No abdominal wall hernia or abnormality. No abdominopelvic ascites. Musculoskeletal: No acute or significant osseous findings. IMPRESSION: 1. Acute appendicitis. 2. Evidence of prior cholecystectomy. Electronically Signed   By: Aram Candela M.D.   On: 01/28/2023 04:15    Review of Systems  Constitutional:  Positive for appetite change.  HENT: Negative.    Eyes: Negative.   Respiratory: Negative.    Cardiovascular: Negative.   Gastrointestinal:  Positive for  abdominal pain, diarrhea, nausea and vomiting.  Genitourinary: Negative.   Musculoskeletal: Negative.   Skin: Negative.   Allergic/Immunologic: Negative.   Neurological: Negative.   Hematological: Negative.   Psychiatric/Behavioral: Negative.      Blood pressure (!) 100/50, pulse 70, temperature 98.3 F (36.8 C), temperature source Oral, resp. rate 16, height 5\' 9"  (1.753 m), weight 68 kg, SpO2 100%. Physical Exam HENT:     Head: Normocephalic.  Cardiovascular:     Rate and Rhythm: Normal rate and regular rhythm.  Pulmonary:     Effort: Pulmonary effort is normal.     Breath sounds: Normal breath sounds. No wheezing.  Abdominal:     General: Abdomen is flat.     Palpations: Abdomen is soft.     Tenderness: There is abdominal tenderness in the right lower quadrant. There is no guarding or rebound.     Hernia: No hernia is present.  Skin:    General: Skin is warm.  Neurological:     Mental Status: She is alert and oriented to person, place, and time.  Psychiatric:        Mood and Affect: Mood normal.      Assessment/Plan Acute appendicitis -she received Zosyn IV.  I recommend laparoscopic appendectomy today by Dr. Dossie Der.  I also discussed the possibly of nonoperative management with antibiotics with her as well.  She would like to proceed with appendectomy.  I discussed the procedure, risks, and benefits.  She is agreeable.  Liz Malady, MD 01/28/2023, 6:34 AM

## 2023-01-28 NOTE — Transfer of Care (Signed)
Immediate Anesthesia Transfer of Care Note  Patient: Jennifer Petersen  Procedure(s) Performed: APPENDECTOMY LAPAROSCOPIC (Abdomen)  Patient Location: PACU  Anesthesia Type:General  Level of Consciousness: drowsy and patient cooperative  Airway & Oxygen Therapy: Patient Spontanous Breathing  Post-op Assessment: Report given to RN and Post -op Vital signs reviewed and stable  Post vital signs: Reviewed and stable  Last Vitals:  Vitals Value Taken Time  BP 107/56 01/28/23 1157  Temp    Pulse 70 01/28/23 1200  Resp 16 01/28/23 1200  SpO2 96 % 01/28/23 1200  Vitals shown include unfiled device data.  Last Pain:  Vitals:   01/28/23 1028  TempSrc:   PainSc: 2       Patients Stated Pain Goal: 0 (01/28/23 1028)  Complications: No notable events documented.

## 2023-01-28 NOTE — Anesthesia Preprocedure Evaluation (Signed)
Anesthesia Evaluation  Patient identified by MRN, date of birth, ID band Patient awake    Reviewed: Allergy & Precautions, H&P , NPO status , Patient's Chart, lab work & pertinent test results  Airway Mallampati: I  TM Distance: >3 FB Neck ROM: Full    Dental no notable dental hx.    Pulmonary neg pulmonary ROS   Pulmonary exam normal breath sounds clear to auscultation       Cardiovascular negative cardio ROS Normal cardiovascular exam Rhythm:Regular Rate:Normal     Neuro/Psych negative neurological ROS  negative psych ROS   GI/Hepatic negative GI ROS, Neg liver ROS,,,  Endo/Other  negative endocrine ROS    Renal/GU negative Renal ROS  negative genitourinary   Musculoskeletal negative musculoskeletal ROS (+)    Abdominal   Peds negative pediatric ROS (+)  Hematology negative hematology ROS (+)   Anesthesia Other Findings   Reproductive/Obstetrics negative OB ROS                             Anesthesia Physical Anesthesia Plan  ASA: 1  Anesthesia Plan: General   Post-op Pain Management: Toradol IV (intra-op)*   Induction: Intravenous  PONV Risk Score and Plan: 3 and Ondansetron, Dexamethasone, Midazolam and Treatment may vary due to age or medical condition  Airway Management Planned: Oral ETT  Additional Equipment:   Intra-op Plan:   Post-operative Plan: Extubation in OR  Informed Consent: I have reviewed the patients History and Physical, chart, labs and discussed the procedure including the risks, benefits and alternatives for the proposed anesthesia with the patient or authorized representative who has indicated his/her understanding and acceptance.     Dental advisory given  Plan Discussed with: CRNA and Surgeon  Anesthesia Plan Comments:        Anesthesia Quick Evaluation

## 2023-01-28 NOTE — Anesthesia Postprocedure Evaluation (Signed)
Anesthesia Post Note  Patient: Jennifer Petersen  Procedure(s) Performed: APPENDECTOMY LAPAROSCOPIC (Abdomen)     Patient location during evaluation: PACU Anesthesia Type: General Level of consciousness: awake and alert Pain management: pain level controlled Vital Signs Assessment: post-procedure vital signs reviewed and stable Respiratory status: spontaneous breathing, nonlabored ventilation, respiratory function stable and patient connected to nasal cannula oxygen Cardiovascular status: blood pressure returned to baseline and stable Postop Assessment: no apparent nausea or vomiting Anesthetic complications: no  No notable events documented.  Last Vitals:  Vitals:   01/28/23 1157 01/28/23 1200  BP: (!) 107/56 (!) 119/58  Pulse: 73 70  Resp: 18 16  Temp: 37 C   SpO2: 98% 96%    Last Pain:  Vitals:   01/28/23 1028  TempSrc:   PainSc: 2                  Emeree Mahler S

## 2023-01-29 ENCOUNTER — Encounter (HOSPITAL_COMMUNITY): Payer: Self-pay | Admitting: Surgery

## 2023-01-29 LAB — SURGICAL PATHOLOGY

## 2023-02-28 DIAGNOSIS — Z0000 Encounter for general adult medical examination without abnormal findings: Secondary | ICD-10-CM | POA: Diagnosis not present

## 2023-02-28 DIAGNOSIS — D509 Iron deficiency anemia, unspecified: Secondary | ICD-10-CM | POA: Diagnosis not present

## 2023-02-28 DIAGNOSIS — Z0189 Encounter for other specified special examinations: Secondary | ICD-10-CM | POA: Diagnosis not present

## 2023-02-28 DIAGNOSIS — Z1212 Encounter for screening for malignant neoplasm of rectum: Secondary | ICD-10-CM | POA: Diagnosis not present

## 2023-02-28 DIAGNOSIS — E785 Hyperlipidemia, unspecified: Secondary | ICD-10-CM | POA: Diagnosis not present

## 2023-03-05 DIAGNOSIS — Z0000 Encounter for general adult medical examination without abnormal findings: Secondary | ICD-10-CM | POA: Diagnosis not present

## 2023-03-05 DIAGNOSIS — Z1212 Encounter for screening for malignant neoplasm of rectum: Secondary | ICD-10-CM | POA: Diagnosis not present

## 2023-03-05 DIAGNOSIS — Z0189 Encounter for other specified special examinations: Secondary | ICD-10-CM | POA: Diagnosis not present

## 2023-03-05 DIAGNOSIS — E785 Hyperlipidemia, unspecified: Secondary | ICD-10-CM | POA: Diagnosis not present

## 2023-03-05 DIAGNOSIS — D509 Iron deficiency anemia, unspecified: Secondary | ICD-10-CM | POA: Diagnosis not present

## 2023-03-06 DIAGNOSIS — R3121 Asymptomatic microscopic hematuria: Secondary | ICD-10-CM | POA: Diagnosis not present

## 2023-03-06 DIAGNOSIS — M419 Scoliosis, unspecified: Secondary | ICD-10-CM | POA: Diagnosis not present

## 2023-03-06 DIAGNOSIS — R82998 Other abnormal findings in urine: Secondary | ICD-10-CM | POA: Diagnosis not present

## 2023-03-06 DIAGNOSIS — Z23 Encounter for immunization: Secondary | ICD-10-CM | POA: Diagnosis not present

## 2023-03-06 DIAGNOSIS — Z1331 Encounter for screening for depression: Secondary | ICD-10-CM | POA: Diagnosis not present

## 2023-03-06 DIAGNOSIS — Z1339 Encounter for screening examination for other mental health and behavioral disorders: Secondary | ICD-10-CM | POA: Diagnosis not present

## 2023-03-06 DIAGNOSIS — N951 Menopausal and female climacteric states: Secondary | ICD-10-CM | POA: Diagnosis not present

## 2023-03-06 DIAGNOSIS — E785 Hyperlipidemia, unspecified: Secondary | ICD-10-CM | POA: Diagnosis not present

## 2023-03-06 DIAGNOSIS — Z800 Family history of malignant neoplasm of digestive organs: Secondary | ICD-10-CM | POA: Diagnosis not present

## 2023-03-06 DIAGNOSIS — Z0000 Encounter for general adult medical examination without abnormal findings: Secondary | ICD-10-CM | POA: Diagnosis not present

## 2023-03-06 DIAGNOSIS — D509 Iron deficiency anemia, unspecified: Secondary | ICD-10-CM | POA: Diagnosis not present

## 2023-03-12 DIAGNOSIS — D1801 Hemangioma of skin and subcutaneous tissue: Secondary | ICD-10-CM | POA: Diagnosis not present

## 2023-03-12 DIAGNOSIS — L819 Disorder of pigmentation, unspecified: Secondary | ICD-10-CM | POA: Diagnosis not present

## 2023-03-12 DIAGNOSIS — L309 Dermatitis, unspecified: Secondary | ICD-10-CM | POA: Diagnosis not present

## 2023-03-12 DIAGNOSIS — D224 Melanocytic nevi of scalp and neck: Secondary | ICD-10-CM | POA: Diagnosis not present

## 2023-03-12 DIAGNOSIS — D2239 Melanocytic nevi of other parts of face: Secondary | ICD-10-CM | POA: Diagnosis not present

## 2023-03-12 DIAGNOSIS — B070 Plantar wart: Secondary | ICD-10-CM | POA: Diagnosis not present

## 2023-03-12 DIAGNOSIS — D225 Melanocytic nevi of trunk: Secondary | ICD-10-CM | POA: Diagnosis not present

## 2023-03-12 DIAGNOSIS — D485 Neoplasm of uncertain behavior of skin: Secondary | ICD-10-CM | POA: Diagnosis not present

## 2023-03-12 DIAGNOSIS — L821 Other seborrheic keratosis: Secondary | ICD-10-CM | POA: Diagnosis not present

## 2023-04-17 DIAGNOSIS — Z1331 Encounter for screening for depression: Secondary | ICD-10-CM | POA: Diagnosis not present

## 2023-04-17 DIAGNOSIS — Z01419 Encounter for gynecological examination (general) (routine) without abnormal findings: Secondary | ICD-10-CM | POA: Diagnosis not present

## 2023-04-17 DIAGNOSIS — Z1231 Encounter for screening mammogram for malignant neoplasm of breast: Secondary | ICD-10-CM | POA: Diagnosis not present
